# Patient Record
Sex: Male | Born: 1945 | Race: Black or African American | Hispanic: No | Marital: Married | Smoking: Former smoker
Health system: Southern US, Community
[De-identification: ages and names within clinical notes are randomized; demographics above are authoritative.]

## PROBLEM LIST (undated history)

## (undated) DIAGNOSIS — Z7739 Contact with and (suspected) exposure to other war theater: Secondary | ICD-10-CM

## (undated) DIAGNOSIS — K219 Gastro-esophageal reflux disease without esophagitis: Secondary | ICD-10-CM

## (undated) DIAGNOSIS — K036 Deposits [accretions] on teeth: Secondary | ICD-10-CM

## (undated) DIAGNOSIS — F4312 Post-traumatic stress disorder, chronic: Secondary | ICD-10-CM

## (undated) DIAGNOSIS — T7840XA Allergy, unspecified, initial encounter: Secondary | ICD-10-CM

## (undated) DIAGNOSIS — I8310 Varicose veins of unspecified lower extremity with inflammation: Secondary | ICD-10-CM

## (undated) DIAGNOSIS — I10 Essential (primary) hypertension: Secondary | ICD-10-CM

## (undated) DIAGNOSIS — K0252 Dental caries on pit and fissure surface penetrating into dentin: Secondary | ICD-10-CM

## (undated) DIAGNOSIS — L209 Atopic dermatitis, unspecified: Secondary | ICD-10-CM

## (undated) DIAGNOSIS — M1990 Unspecified osteoarthritis, unspecified site: Secondary | ICD-10-CM

## (undated) DIAGNOSIS — K0262 Dental caries on smooth surface penetrating into dentin: Secondary | ICD-10-CM

## (undated) DIAGNOSIS — G4733 Obstructive sleep apnea (adult) (pediatric): Secondary | ICD-10-CM

## (undated) DIAGNOSIS — H2513 Age-related nuclear cataract, bilateral: Secondary | ICD-10-CM

## (undated) DIAGNOSIS — L301 Dyshidrosis [pompholyx]: Secondary | ICD-10-CM

## (undated) DIAGNOSIS — J310 Chronic rhinitis: Secondary | ICD-10-CM

## (undated) DIAGNOSIS — E291 Testicular hypofunction: Secondary | ICD-10-CM

## (undated) DIAGNOSIS — E669 Obesity, unspecified: Secondary | ICD-10-CM

## (undated) DIAGNOSIS — G8929 Other chronic pain: Secondary | ICD-10-CM

## (undated) DIAGNOSIS — H04129 Dry eye syndrome of unspecified lacrimal gland: Secondary | ICD-10-CM

## (undated) DIAGNOSIS — G4730 Sleep apnea, unspecified: Secondary | ICD-10-CM

## (undated) DIAGNOSIS — L299 Pruritus, unspecified: Secondary | ICD-10-CM

## (undated) DIAGNOSIS — J338 Other polyp of sinus: Secondary | ICD-10-CM

## (undated) DIAGNOSIS — N62 Hypertrophy of breast: Secondary | ICD-10-CM

## (undated) DIAGNOSIS — Z77098 Contact with and (suspected) exposure to other hazardous, chiefly nonmedicinal, chemicals: Secondary | ICD-10-CM

## (undated) DIAGNOSIS — H35373 Puckering of macula, bilateral: Secondary | ICD-10-CM

## (undated) DIAGNOSIS — F4310 Post-traumatic stress disorder, unspecified: Secondary | ICD-10-CM

## (undated) HISTORY — DX: Allergy, unspecified, initial encounter: T78.40XA

## (undated) HISTORY — DX: Unspecified osteoarthritis, unspecified site: M19.90

## (undated) HISTORY — DX: Dyshidrosis (pompholyx): L30.1

## (undated) HISTORY — DX: Varicose veins of unspecified lower extremity with inflammation: I83.10

## (undated) HISTORY — DX: Post-traumatic stress disorder, chronic: F43.12

## (undated) HISTORY — DX: Gastro-esophageal reflux disease without esophagitis: K21.9

## (undated) HISTORY — DX: Age-related nuclear cataract, bilateral: H25.13

## (undated) HISTORY — DX: Other chronic pain: G89.29

## (undated) HISTORY — DX: Testicular hypofunction: E29.1

## (undated) HISTORY — DX: Puckering of macula, bilateral: H35.373

## (undated) HISTORY — DX: Pruritus, unspecified: L29.9

## (undated) HISTORY — PX: COLONOSCOPY: SHX174

## (undated) HISTORY — DX: Essential (primary) hypertension: I10

## (undated) HISTORY — DX: Dental caries on smooth surface penetrating into dentin: K02.62

## (undated) HISTORY — DX: Obesity, unspecified: E66.9

## (undated) HISTORY — DX: Other polyp of sinus: J33.8

## (undated) HISTORY — DX: Deposits (accretions) on teeth: K03.6

## (undated) HISTORY — DX: Contact with and (suspected) exposure to other war theater: Z77.39

## (undated) HISTORY — DX: Dental caries on pit and fissure surface penetrating into dentin: K02.52

## (undated) HISTORY — DX: Obstructive sleep apnea (adult) (pediatric): G47.33

## (undated) HISTORY — DX: Hypertrophy of breast: N62

## (undated) HISTORY — DX: Chronic rhinitis: J31.0

## (undated) HISTORY — DX: Atopic dermatitis, unspecified: L20.9

## (undated) HISTORY — PX: SHOULDER ARTHROSCOPY: SHX128

## (undated) HISTORY — DX: Contact with and (suspected) exposure to other hazardous, chiefly nonmedicinal, chemicals: Z77.098

## (undated) HISTORY — PX: APPENDECTOMY: SHX54

---

## 1998-03-02 ENCOUNTER — Ambulatory Visit: Admission: RE | Admit: 1998-03-02 | Discharge: 1998-03-02 | Payer: Self-pay | Admitting: Family Medicine

## 1998-11-17 ENCOUNTER — Encounter: Payer: Self-pay | Admitting: Family Medicine

## 1998-11-17 ENCOUNTER — Ambulatory Visit (HOSPITAL_COMMUNITY): Admission: RE | Admit: 1998-11-17 | Discharge: 1998-11-17 | Payer: Self-pay | Admitting: Family Medicine

## 2000-09-04 ENCOUNTER — Encounter: Admission: RE | Admit: 2000-09-04 | Discharge: 2000-09-04 | Payer: Self-pay | Admitting: Family Medicine

## 2000-09-04 ENCOUNTER — Encounter: Payer: Self-pay | Admitting: Family Medicine

## 2001-01-09 ENCOUNTER — Ambulatory Visit (HOSPITAL_BASED_OUTPATIENT_CLINIC_OR_DEPARTMENT_OTHER): Admission: RE | Admit: 2001-01-09 | Discharge: 2001-01-09 | Payer: Self-pay | Admitting: Orthopedic Surgery

## 2002-09-15 ENCOUNTER — Encounter: Admission: RE | Admit: 2002-09-15 | Discharge: 2002-09-15 | Payer: Self-pay | Admitting: Family Medicine

## 2002-09-15 ENCOUNTER — Encounter: Payer: Self-pay | Admitting: Family Medicine

## 2003-01-11 ENCOUNTER — Encounter: Payer: Self-pay | Admitting: Family Medicine

## 2003-01-11 ENCOUNTER — Encounter: Admission: RE | Admit: 2003-01-11 | Discharge: 2003-01-11 | Payer: Self-pay | Admitting: Family Medicine

## 2003-10-12 ENCOUNTER — Encounter: Admission: RE | Admit: 2003-10-12 | Discharge: 2003-10-12 | Payer: Self-pay | Admitting: Family Medicine

## 2006-07-29 ENCOUNTER — Ambulatory Visit: Payer: Self-pay | Admitting: Gastroenterology

## 2006-08-05 ENCOUNTER — Ambulatory Visit: Payer: Self-pay | Admitting: Gastroenterology

## 2006-08-05 ENCOUNTER — Encounter (INDEPENDENT_AMBULATORY_CARE_PROVIDER_SITE_OTHER): Payer: Self-pay | Admitting: Specialist

## 2007-02-17 ENCOUNTER — Encounter: Admission: RE | Admit: 2007-02-17 | Discharge: 2007-02-17 | Payer: Self-pay | Admitting: Family Medicine

## 2007-06-25 ENCOUNTER — Emergency Department (HOSPITAL_COMMUNITY): Admission: EM | Admit: 2007-06-25 | Discharge: 2007-06-25 | Payer: Self-pay | Admitting: Emergency Medicine

## 2007-07-03 ENCOUNTER — Ambulatory Visit (HOSPITAL_BASED_OUTPATIENT_CLINIC_OR_DEPARTMENT_OTHER): Admission: RE | Admit: 2007-07-03 | Discharge: 2007-07-04 | Payer: Self-pay | Admitting: Orthopedic Surgery

## 2008-03-23 ENCOUNTER — Ambulatory Visit (HOSPITAL_BASED_OUTPATIENT_CLINIC_OR_DEPARTMENT_OTHER): Admission: RE | Admit: 2008-03-23 | Discharge: 2008-03-24 | Payer: Self-pay | Admitting: Orthopedic Surgery

## 2009-05-11 IMAGING — CR DG KNEE 1-2V*L*
2 series · 2 of 2 positions shown · non-contrast
Comparison: none

CLINICAL DATA: Left knee pain.  No injury. 
 LEFT KNEE TWO VIEWS:
 Two views of the left knee were obtained.  Joint spaces are well preserved.  No fracture or effusion is seen.

[view not recorded (1 of 2)]
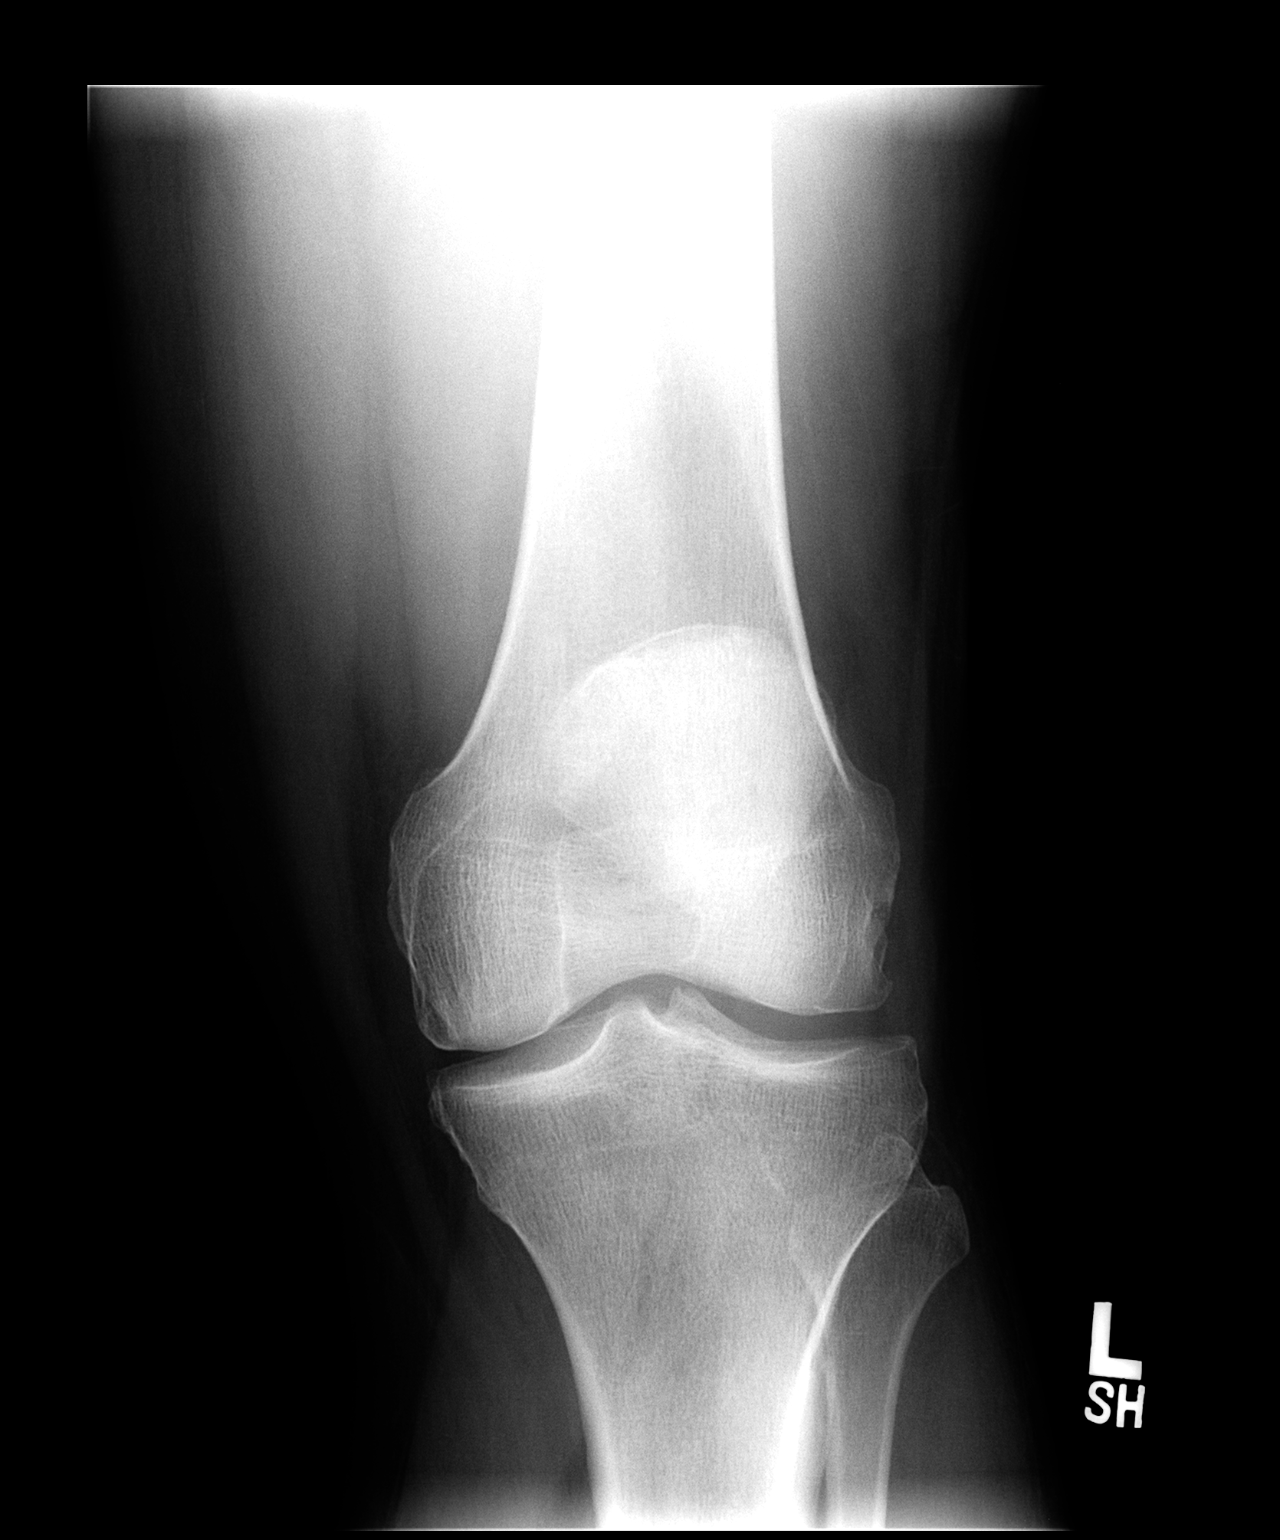

[view not recorded (2 of 2)]
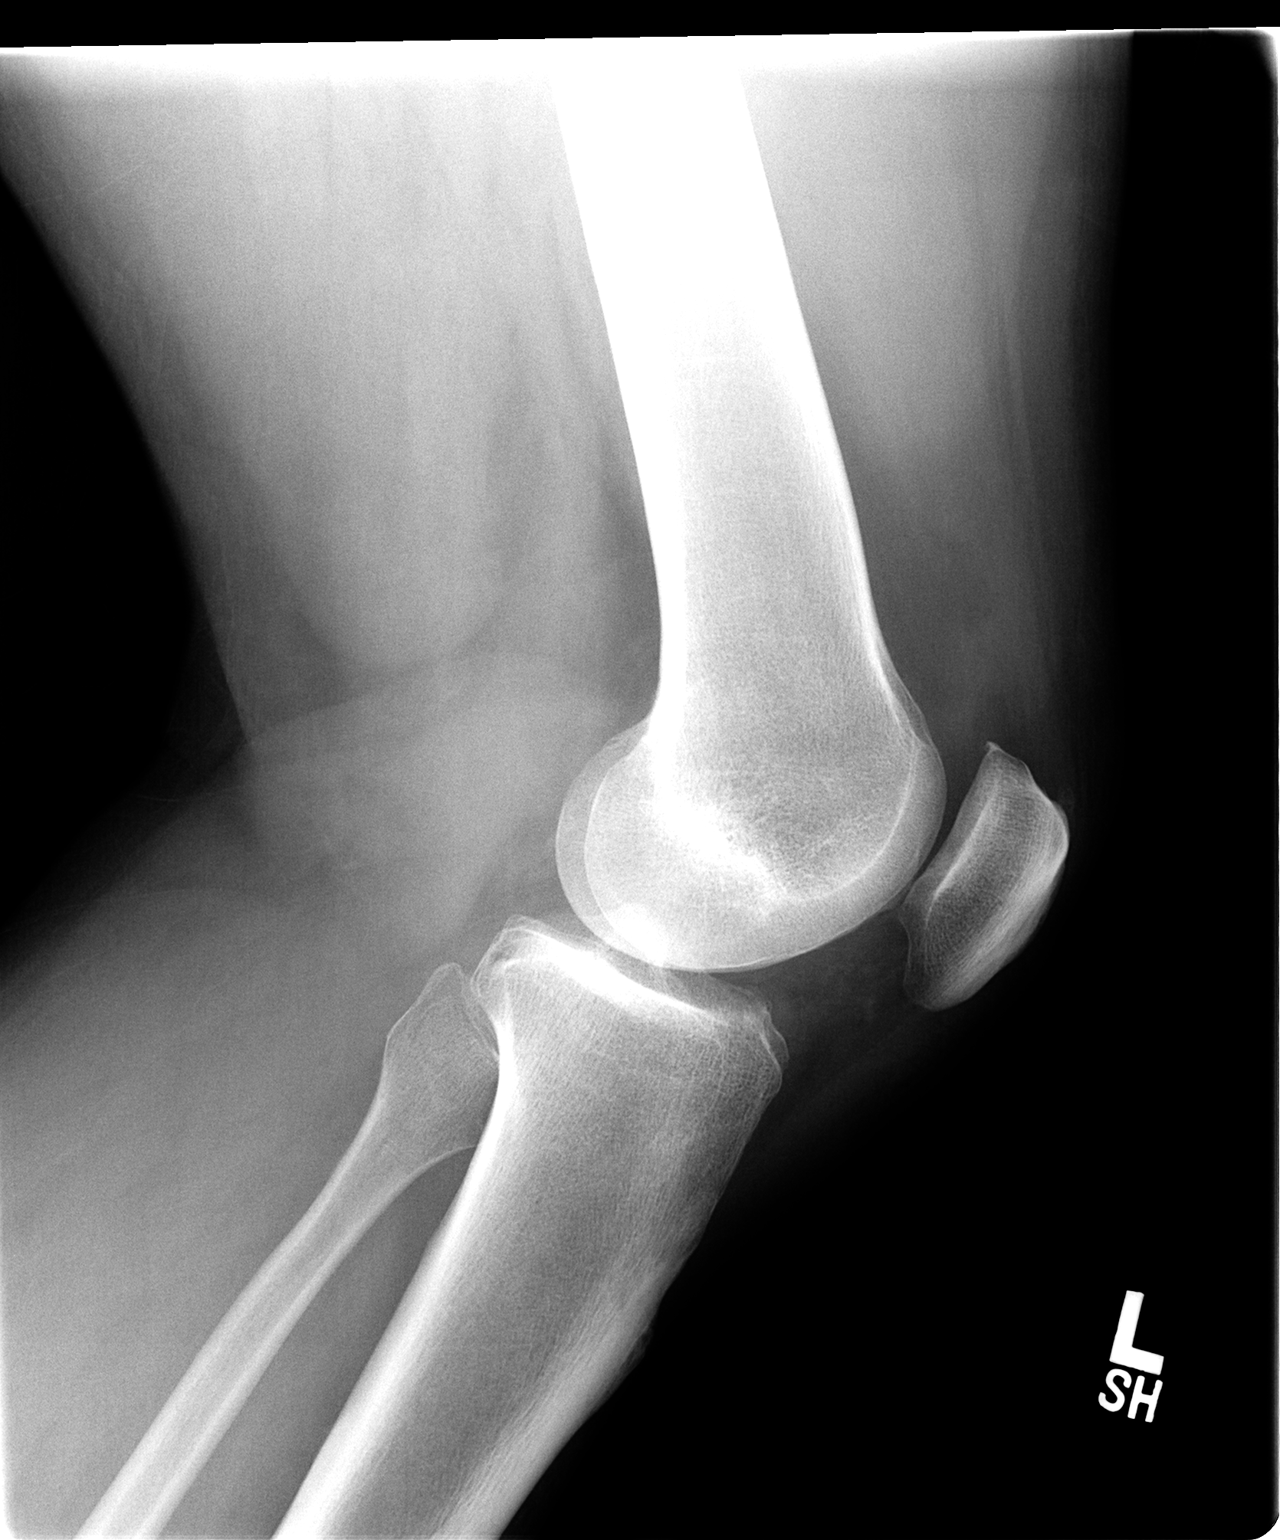

[2 of 2 positions shown; findings below may reference images not displayed]

IMPRESSION: Negative left knee.

## 2010-09-05 NOTE — Op Note (Signed)
[name], [name]               ACCOUNT NO.:  [id number]   MEDICAL RECORD NO.:  [id number]          PATIENT TYPE:  AMB   LOCATION:  DSC                          FACILITY:  [facility]   PHYSICIAN:  Katy Fitch. Sypher, M.D. 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   POSTOPERATIVE DIAGNOSIS:  Complex mid substance necrotic explosion type  rupture of the supraspinatus, infraspinatus and teres minor tendon and  partial superior degenerative tear of subscapularis tendon, right  shoulder.   OPERATION:  1. Diagnostic arthroscopy right glenohumeral joint.  2. Arthroscopic debridement of synovitis and partial debridement of      subscapularis rotator cuff tear and synovectomy.  3. Open subacromial decompression with bursectomy, coracoacromial      ligament relaxation, and resection of medial acromial osteophyte at      acromioclavicular joint.  4. Open resection of distal clavicle.  5. Complex relamination/reconstruction of massive necrotic rotator      cuff tear utilizing two bio-corkscrew anchors to gather the      infraspinatus and teres minor and two McLaughlin through bone      sutures gathering the supraspinatus and infraspinatus and an over-      the-top delay suture to a swivel lock to neutralize tension on the      supraspinatus and infraspinatus rotator cuff repair.   SURGEON:  Katy Fitch. Sypher, M.D.   ASSISTANT:  Jonni Sanger, P.A.  It should be noted that Mr.  Dasnoit's assistance was essential for this complex repair providing  retraction as well as arm positioning for the entire duration of the  case. It should be noted, also, that a scrubbed nursing assistant was  also required to position Mr. Fluegel very heavy  arm as we proceeded  with this complex reconstructive surgery.   ANESTHESIA:  General endotracheal supplemented by a right interscalene  block.   SUPERVISING ANESTHESIOLOGIST:  Quita Skye. Krista Blue, M.D.   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  On June 25, 2007, he slipped on an icy surface  falling very hard onto his right arm in an abducted position.  Since  that time, he has had extreme pain in the right shoulder and has been  unable to abduct the shoulder against gravity. He was initially  evaluated at an [facility] where x-rays of the shoulder revealed  no evidence of fracture.  He was thought to have a possible rotator cuff  tear.  On July 02, 2007, we were asked to provide an urgent upper  extremity orthopedic consult to assess his predicament.  At the time of  his consult, he was noted have no active abduction of his arm and  extreme weakness of external rotation.  Plain films of the shoulder  documented no evidence of fracture.  He had baseline degenerative  arthritis of the  AC joint and compromise of his subacromial outlet. He  did not have sclerosis of the greater tuberosity consistent with chronic  rotator cuff disease.  An MRI of the shoulder had been ordered and  revealed a complex explosion type retracted rotator cuff tear.   During informed consent, I explained to Mr. Khan that he had a very  complex and severe rotator cuff rupture predicament.  The only chance  for repair would be to proceed as soon as possible with exploration and  attempted reconstruction of this tear.  Mid substance tendon tears are  extremely challenging to repair due to poor tissue quality and have a  high rate of failure.  Our goal will be to repair as much of his rotator  cuff as possible to recreate a stabilizing sling effect between the  subscapularis, his infraspinatus and teres minor, and hopefully restore  some of his  supraspinatus function.  Preoperatively, he was advised that  he would not be able to return to work for a minimum of three months and  would not be able to do any overhead lifting for 6-9 months following  this surgery.  No guarantees were provided that we would be able to  repair his tendon nor ensure that a repair accomplished would be  maintained.  After informed consent, he is brought to the operating room  at this time.   DESCRIPTION OF PROCEDURE:  Prior to surgery, he was interviewed by Dr.  Krista Blue and after an explanation of potential risks and benefits of an  interscalene block and general anesthesia, he proceeded with the  interscalene block under Dr. Robina Ade supervision in the holding area  and subsequently was transferred to room 8 and under Dr. Robina Ade direct  supervision, placed under general endotracheal anesthesia.  He was  carefully positioned in the beach chair position with the aid of a torso  and head holder designed for arthroscopy.  The entire right upper  extremity and forequarter were prepped with DuraPrep and draped with  impervious arthroscopy drapes.   The procedure commenced with placement of the arthroscope through a  standard posterior viewing portal.  Diagnostic arthroscopy revealed a  very complex mid substance explosion type tear of the entire  infraspinatus, teres minor and about 80% of the supraspinatus. The long  head of the biceps was intact.  The superior 10% of the subscapularis  had a retracted degenerative tear.  There was a considerable amount of  adhesive capsulitis and granulation tissue present in the anterior  shoulder capsule.  The anterior portal was created under direct vision  and a 4.5 mm suction shaver was used to debride the capsulitis,  synovitis, rotator cuff debris, and the small degenerative fragment of  the subscapularis.   We then proceeded directly to an open reconstruction given the fact that  considerable exposure was  going to be required to try to cobble together  this exploded rotator cuff.  A 10 cm incision was fashioned from the  distal clavicle across the anterior acromion. With great care, the  deltoid anterior third was elevated subperiosteally off the acromion,  the coracoacromial ligament released, and the bursa entered. After  debridement of clot and other fibrinous debris, we were able to assess  the complex rotator cuff predicament.   The supraspinatus posterior 80% had avulsed off the tuberosity.  The  infraspinatus had ruptured in mid substance with a 2 cm long segment  remaining on the tuberosity that was quite necrotic.  The teres  minor  was avulsed partially posteriorly and a considerable longitudinal tear  was noted between the infraspinatus and teres minor.  With great care  utilizing a Joseph skin hook, we were able to retrieve the retracted  infraspinatus and supraspinatus, and ultimately after debriding the  margins of the rupture with scalpel and scissors, we were able to place  a series of baseball stitch sutures gathering the infraspinatus and  supraspinatus. Given the mid substance tear of the infraspinatus, this  repair will be quite tenuous.  Two posterior anchors were placed, one at  the superior margin of the teres minor, one at the central portion of  the infraspinatus, and a pair of McLaughlin through bone suture tunnels  created for repair of the supraspinatus and infraspinatus anteriorly.  The supraspinatus was gathered with one McLaughlin suture and the  infraspinatus with a second and after tensioning these, a total of four  mattress sutures were used to re-inset the teres minor and infraspinatus  to decorticated tuberosity.  The supraspinatus was advanced in anatomic  footprint and inset with through bone McLaughlin sutures.  This was  subsequently finished with an over-the-top delay type suture secured  with a swivel lock anchor and a second suture that is anchored  within  the swivel lock was used as an over-the-top suture, insetting the  margin.  We ultimately re-established the anatomic footprint of the  infraspinatus and supraspinatus, however, given the poor quality of the  infraspinatus, there are no guarantees that this will remain intact.   The wound was thoroughly lavaged with sterile saline.  The distal 15 mm  of clavicle were resected and the medial acromion was resected leveling  the acromion to a type 1 morphology. Complete synovectomy of the [facility]  joint was accomplished.  After lavage of the subacromial space with  sterile saline using the arthroscopic pump, the deltoid was repaired to  the trapezius closing dead space created by resection of the distal  clavicle and the deltoid was then repaired to the periosteum of the  acromion and the capsule of the [facility] joint anatomically with multiple  figure-of-eight sutures of #2 FiberWire knots buried. The deltoid split  anterolaterally was repaired with a simple suture of 0 Vicryl.  After  further irrigation, the skin was repaired with subdermal sutures of 2-0  Vicryl and intradermal 3-0 Prolene with Steri-Strips.  Mr. Frazer was  placed in a Don Joy abduction sling, awakened from general anesthesia,  and transferred to the recovery room with stable signs.  There were no  apparent complications.  Passive compression pumps were used throughout  the procedure for deep vein thrombosis prophylaxis.   During my postoperative conference with Mrs. Mcfall, I had a very  detailed discussion of the complexity of his injury and the poor quality  of his tendon.  I also encouraged her to help him take walks this  evening to try to diminish his risks of deep vein thrombosis.  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 We  will be very vigilant for perioperative complications including deep  vein thrombosis and pulmonary embolus.  We will monitor his O2  saturation this evening  and provide supportive care with p.o. and IV  Dilaudid and Ancef 1 gram IV q.8h. as a prophylactic antibiotic x3  doses.   Note that this was a very complex procedure that will be submitted to  the industrial commission and the insurer as a complex procedure for  additional consideration by report documentation.  In addition, it  should be noted that not one but two surgical assistants were required  for positioning of this very muscular and heavy arm.      Katy Fitch Sypher, M.D.  Electronically Signed     RVS/MEDQ  D:  07/03/2007  T:  07/05/2007  Job:  [id number]

## 2010-09-05 NOTE — Op Note (Signed)
Joseph Khan, Joseph Khan               ACCOUNT NO.:  [id number]   MEDICAL RECORD NO.:  [id number]          PATIENT TYPE:  AMB   LOCATION:  DSC                          FACILITY:  [facility]   PHYSICIAN:  Katy Fitch. Sypher, M.D. DATE OF BIRTH:  Feb 20, 1946   DATE OF PROCEDURE:  03/23/2008  DATE OF DISCHARGE:                               OPERATIVE REPORT   PREOPERATIVE DIAGNOSIS:  A 7-1/2 months status post repair of massive  right rotator cuff tear with necrotic rotator cuff with subsequent  development of profound intra-articular and extra-articular adhesions to  the rotator cuff limiting range of motion, unresponsive to physical  therapy.   POSTOPERATIVE DIAGNOSES:  Severe adhesive capsulitis and tenodesis of  entire rotator cuff to deep surface of clavicle, acromion,  coracoacromial ligament, and deltoid muscle due to avascular status of  repaired rotator cuff.   OPERATION:  1. Examination of right shoulder under anesthesia.  2. Arthroscopic capsulectomy, tenolysis of subscapularis, and      recreation of rotator interval.  3. Extreme tenolysis of rotator cuff with resection of adhesions      between clavicle, acromion, coracoacromial ligament, and deltoid      muscle to rotator cuff with hemostasis of feeding blood vessels and      gentle manipulation of shoulder under anesthesia.   OPERATING SURGEON:  Katy Fitch. Sypher, MD   ASSISTANT:  Marveen Reeks Dasnoit, PA-C   ANESTHESIA:  General endotracheal supplemented by right interscalene  block.   SUPERVISING ANESTHESIOLOGIST:  Guadalupe Maple, MD   INDICATIONS:  Joseph Khan is a 65 year old gentleman employed by [employer].  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   We are able to achieve an anatomic footprint of his repair with a  combination of arthroscopic and open technique.   He went on to heal his tendon but had severe adhesions due to the  avascular  nature of his chronic necrotic rotator cuff tear.   Preoperatively, we had advised him that he would likely require a second  capsulectomy and tenolysis stage given the nature of his primary repair.   After 63 months of therapy, Joseph Khan had combined elevation of his  shoulder of 130 degrees and external rotation at 90 degrees, abduction  of 60 degrees.  He had 40 degrees of internal rotation and could barely  reach behind his iliac crest.   Due to the profound nature of his adhesions and noting the status of his  rotator cuff, we recommended proceeding with examination of his shoulder  under anesthesia and arthroscopic tenolysis and capsulectomy in an  effort to improve his range of motion.   Preoperatively, he was advised that we would try to aggressively  tenolyse his cuff and capsule and consider release of coracohumeral  ligament.  He understands that he will require months of therapy  following this procedure in an effort to optimize his range of motion.   Confounding issues include the fact that Joseph Khan is a very large man,  weighing 258 pounds at 5  feet 6 inches height.   Preoperatively, questions were invited and answered in detail with Mr.  Khan and his wife.  He was provided 1 g of Ancef as an IV prophylactic  antibiotic in preoperative holding area.   After questions were invited and answered, she is brought to the  operating room at this time.   PROCEDURE:  Joseph Khan was brought to the operating room and placed  in a supine position on the operating table.   Following placement of an interscalene block by Dr. Noreene Larsson in the  holding area, a very satisfactory anesthesia of the right shoulder was  noted.   He was brought to room #6 of the [facility], placed in a  supine position on the operating table, and under Dr. Morley Kos direct  supervision, general endotracheal anesthesia induced utilizing a  GlideScope.   Joseph Khan was carefully  positioned in the beach chair position with aid  of a torso and head holders on for arthroscopy.  Examination of right  shoulder under anesthesia revealed combined elevation of 130 degrees  with severe limitation of glenohumeral rotation.  At 90 degrees  abduction, his external rotation was limited at 60 degrees and internal  rotation at 20 degrees.  He lacked extension to the scapular plane by 40  degrees.  The adhesions were so dense that no effort was made to  manipulate the shoulder.   The entire right upper extremity four-quarter then prepped with DuraPrep  and draped with impervious arthroscopy drapes.   The procedure commenced with instrumentation of the shoulder from an  anterior approach utilizing the prior anterior portal with a switching  stick.  The scope was placed with inside-out technique into the  glenohumeral joint.  The first looking into the glenohumeral joint  revealed a profound degree of inflammation and fibrous adhesions.  The  shoulder joint was distended with a pump set at 80 cm of water.  The  anterior capsular anatomy was obliterated by dense fibrous adhesions  from the glenoid labrum to the biceps tendon and lesser tuberosity.  The  anatomy of the subscapularis was completely obliterated, and the rotator  interval anatomy was severely deranged and contracted.   An anterior portal was created with some difficulty through the dense  scar utilizing an 18-gauge needle followed by a dilator switching stick  cannula and ultimately the suction shaver.  A 4.5-mm suction shaver was  used to perform anterior capsulectomy with use of cutting cautery to  release adhesions and aggressive debridement until the subscapularis  anatomy was identified.  The subscapularis was tenolysed, both intra-  articularly and the superficial anterior surface.  Care was taken not to  violate the coracoid nor the conjoined tendon.   The rotator interval was reestablished by capsulectomy  and the long head  of the biceps was tenolysed.  The repair of the supraspinatus,  infraspinatus, teres minor was well visualized and the through bone  sutures were seen.  A very satisfactory anatomic footprint along the  articular surface had been recreated.  The shoulder joint was  meticulously debrided of all granulation tissue followed by use of the  bipolar cautery for hemostasis.   The scope was removed from the glenohumeral joint and placed through the  posterior portal into the subacromial space.  With great difficulty, a  lateral portal was established once again dealing with very dense scar  in the subacromial space.  With bimanual technique, we created a  subacromial space and with  a 4.5-mm suction shaver, began a tenolysis of  the rotator cuff from the deep surface of the acromion, clavicle,  coracoacromial ligament, and deltoid.  Nearly 1 hour of dissection was  required meticulously tenolysing the rotator cuff.  The stack suture  knots were identified and carefully preserved throughout the procedure.  There was a very dense adhesion at the site of the deltoid split for the  repair.  This was ultimately removed until muscle fiber of the deltoid  was visible.   At the conclusion of this debridement, we were able to increase the  internal and external rotation arc by at least 60 degrees.  Due to  swelling of the shoulder, a combined elevation was not possible to  check.   Our hope is that this extensive debridement will facilitate early  rehabilitation once the swelling subsides and allow Mr. Trompeter to begin  a daily therapy program with an effort to recover as near as possible  anatomic range of motion.   On a positive note, we had excellent healing of the rotator cuff to an  anatomic footprint.  Given the avascular nature of the cuff at the time  of the primary repair, it is likely, however, that there will be a  tendency to form new adhesions due to the avascular  nature of the  tendon.   Early range of motion will be employed to try to prevent recurrent  adhesions.   Mr. Falzone portals were repaired with subcutaneous suture of 4-0  Vicryl and intradermal 3-0 Prolene followed by dressing with sterile  gauze and paper tape.  He was placed in a sling and transferred to the  recovery room in stable signs.   We will gently range his shoulder later this evening while the block is  still in place.      Katy Fitch Sypher, M.D.  Electronically Signed     RVS/MEDQ  D:  03/23/2008  T:  03/24/2008  Job:  [id number]

## 2010-09-08 NOTE — Op Note (Signed)
[facility]  Patient:    Joseph Khan, Joseph Khan Visit Number: [id number] MRN: [id number]          Service Type: DSU Location: [facility] Attending Physician:  Nadara Mustard Proc. Date: 01/09/01 Admit Date:  01/09/2001                             Operative Report  PREOPERATIVE DIAGNOSIS:  Right knee medial and lateral meniscal tear.  POSTOPERATIVE DIAGNOSIS:  Right knee medial and lateral meniscal tear.  PROCEDURE:  Partial medial and lateral meniscectomy as well as partial synovectomy.  SURGEON:  Nadara Mustard, M.D.  ANESTHESIA:  General endotracheal anesthesia.  ESTIMATED BLOOD LOSS:  Minimal.  ANTIBIOTICS:  None.  DRAINS:  None.  COMPLICATIONS:  None.  INJECTION:  20 cc of 0.5% Marcaine plain with 4 mg of morphine.  DISPOSITION:  PACU in stable condition.  INDICATIONS:  The patient is a 65 year old gentleman with mechanical catching and locking of his right knee.  The patient has failed conservative care.  MRI scans confirm the medial and lateral meniscal tear and presents at this time for surgical intervention.  The risks and benefits were discussed including infection, neurovascular injury, persistent pain, need for additional surgery. The patient states he understands and wishes to proceed at this time.  DESCRIPTION OF PROCEDURE:  The patient was brought to OR room 5 and underwent a general anesthesia.  After adequate level of anesthesia was obtained, the patients right lower extremity was prepped using Duraprep and draped in the sterile field.  Inferolateral portal was established for the arthroscope and inferomedial portal was established for the working portal.  Visualization of the patellofemoral joint showed a significant amount of synovitis. Visualization of the medial joint line showed no significant pathology of the medial femoral condyle or medial tibial plateau.  He did have a large tear in the posterior horn of the medial  meniscus and using the biter and shaver this was debrided.  The synovitis was also debrided and vapor was used for hemostasis.  The ACL was stable and intact.  Examination of the lateral joint line showed degenerative tearing of the lateral aspect of the lateral meniscus and this was debrided with a shaver.  The anterior synovitis was also debrided and the vapor mytech was used for hemostasis.  This was not touched on any cartilage.  Visualization as well as probing of the medial and lateral joint line showed a stable meniscal remnants.  He had a stable meniscus both medially and laterally.  Examination of the patellofemoral joint showed significant synovitis as well as some mild degenerative changes of the patellofemoral joint with ebernated bone over the trochlea.  The shaver was used for debridement of the synovitis.  Vapor was used for hemostasis.  Medial and lateral gutters were checked.  There were no loose bodies.  The joint was irrigated.  The instruments were removed.  The portals were closed using 4-0 nylon.  The wound was covered with Adaptic, orthopedic sponges, ABD dressing, and Webril and Ace wrap from the toes to the thigh.  The patient was extubated and taken to PACU in stable condition.  Total injection 20 cc of 0.5% Marcaine plain with 4 mg of morphine.  Plan to follow up in the office in two weeks. Attending Physician:  Nadara Mustard DD:  01/09/01 TD:  01/09/01 Job: [id number]

## 2011-01-15 LAB — POCT I-STAT, CHEM 8
BUN: 22
Calcium, Ion: 1.17
Chloride: 101
Creatinine, Ser: 1.2
Glucose, Bld: 94
HCT: 42
Hemoglobin: 14.3
Potassium: 3.8
Sodium: 139
TCO2: 27

## 2011-01-23 LAB — BASIC METABOLIC PANEL
BUN: 12 mg/dL (ref 6–23)
CO2: 30 mEq/L (ref 19–32)
Calcium: 9.1 mg/dL (ref 8.4–10.5)
Chloride: 106 mEq/L (ref 96–112)
Creatinine, Ser: 1.15 mg/dL (ref 0.4–1.5)
GFR calc Af Amer: >60 mL/min (ref >60)
GFR calc non Af Amer: >60 mL/min (ref >60)
Glucose, Bld: 121 mg/dL — ABNORMAL HIGH (ref 70–99)
Potassium: 3.9 mEq/L (ref 3.5–5.1)
Sodium: 142 mEq/L (ref 135–145)

## 2011-01-25 LAB — POCT HEMOGLOBIN-HEMACUE: Hemoglobin: 14.9 g/dL (ref 13.0–17.0)

## 2011-08-01 ENCOUNTER — Encounter: Payer: Self-pay | Admitting: Gastroenterology

## 2016-01-03 NOTE — H&P (Signed)
TOTAL KNEE ADMISSION H&P  Patient is being admitted for left total knee arthroplasty.  Subjective:  Chief Complaint:     Left knee primary OA / pain  HPI: Antwaan Helmkamp, 70 y.o. male, has a history of pain and functional disability in the left knee due to arthritis and has failed non-surgical conservative treatments for greater than 12 weeks to include NSAID's and/or analgesics, corticosteriod injections, viscosupplementation injections and activity modification.  Onset of symptoms was gradual, starting 5-6 years ago with gradually worsening course since that time. The patient noted no past surgery on the left knee(s).  Patient currently rates pain in the left knee(s) at 8 out of 10 with activity. Patient has worsening of pain with activity and weight bearing, pain that interferes with activities of daily living, pain with passive range of motion, crepitus and joint swelling.  Patient has evidence of periarticular osteophytes and joint space narrowing by imaging studies. There is no active infection.  Risks, benefits and expectations were discussed with the patient.  Risks including but not limited to the risk of anesthesia, blood clots, nerve damage, blood vessel damage, failure of the prosthesis, infection and up to and including death.  Patient understand the risks, benefits and expectations and wishes to proceed with surgery.   PCP: Donnie Coffin, MD  D/C Plans:      Home  Post-op Meds:       No Rx given  Tranexamic Acid:      To be given - IV   Decadron:     Iis to be given  FYI:     ASA  Norco  CPAP     Past Medical History:  Diagnosis Date  . Arthritis    Osteoarthrits /pain left knee  . Chronically dry eyes    bilateral  . GERD (gastroesophageal reflux disease)    omeprazole controls  . Hypertension   . Post traumatic stress disorder (PTSD)    "night mares, shadow boxing"  . Sleep apnea    Cpap settings automatic    Past Surgical History:  Procedure Laterality Date   . APPENDECTOMY    . SHOULDER ARTHROSCOPY Right    rotator cuff repair    No prescriptions prior to admission.   No Known Allergies   Social History  Substance Use Topics  . Smoking status: Former Smoker    Years: 10.00    Types: Cigars    Quit date: 01/10/1982  . Smokeless tobacco: Never Used  . Alcohol use No    Family History  Problem Relation Age of Onset  . CVA Mother   . Other Father      Review of Systems  Constitutional: Negative.   HENT: Negative.   Eyes: Negative.   Respiratory: Negative.   Cardiovascular: Negative.   Gastrointestinal: Positive for heartburn.  Genitourinary: Negative.   Musculoskeletal: Positive for joint pain.  Skin: Negative.   Neurological: Negative.   Endo/Heme/Allergies: Positive for environmental allergies.  Psychiatric/Behavioral: Positive for depression (PTSD).    Objective:  Physical Exam  Constitutional: He is oriented to person, place, and time. He appears well-developed.  HENT:  Head: Normocephalic.  Mouth/Throat: He has dentures.  Eyes: Pupils are equal, round, and reactive to light.  Neck: Neck supple. No JVD present. No tracheal deviation present. No thyromegaly present.  Cardiovascular: Normal rate, regular rhythm, normal heart sounds and intact distal pulses.   Respiratory: Effort normal and breath sounds normal. No respiratory distress. He has no wheezes.  GI: Soft. There is no  tenderness. There is no guarding.  Musculoskeletal:       Left knee: He exhibits decreased range of motion, swelling and bony tenderness. He exhibits no ecchymosis, no deformity, no laceration and no erythema. Tenderness found.  Lymphadenopathy:    He has no cervical adenopathy.  Neurological: He is alert and oriented to person, place, and time.  Skin: Skin is warm and dry.  Psychiatric: He has a normal mood and affect.       Imaging Review Plain radiographs demonstrate severe degenerative joint disease of the left knee(s). The overall  alignment isneutral. The bone quality appears to be good for age and reported activity level.  Assessment/Plan:  End stage arthritis, left knee   The patient history, physical examination, clinical judgment of the provider and imaging studies are consistent with end stage degenerative joint disease of the left knee(s) and total knee arthroplasty is deemed medically necessary. The treatment options including medical management, injection therapy arthroscopy and arthroplasty were discussed at length. The risks and benefits of total knee arthroplasty were presented and reviewed. The risks due to aseptic loosening, infection, stiffness, patella tracking problems, thromboembolic complications and other imponderables were discussed. The patient acknowledged the explanation, agreed to proceed with the plan and consent was signed. Patient is being admitted for inpatient treatment for surgery, pain control, PT, OT, prophylactic antibiotics, VTE prophylaxis, progressive ambulation and ADL's and discharge planning. The patient is planning to be discharged home with home health services.     [place] Torren Maffeo   PA-C  01/12/2016, 1:06 PM

## 2016-01-10 NOTE — Patient Instructions (Addendum)
Joseph Khan  01/10/2016   Your procedure is scheduled on: 01-17-16   Report to [facility]  Entrance take [facility]  elevators to 3rd floor to  [place] at   0830 AM.  Call this number if you have problems the morning of surgery [phone]   Remember: ONLY 1 PERSON MAY GO WITH YOU TO SHORT STAY TO GET  READY MORNING OF [facility].  Do not eat food or drink liquids :After Midnight.   Bring cpap mask/tubing.  Take these medicines the morning of surgery with A SIP OF WATER: Loratadine. Omeprazole. DO NOT TAKE ANY DIABETIC MEDICATIONS DAY OF YOUR SURGERY                               You may not have any metal on your body including hair pins and              piercings  Do not wear jewelry, make-up, lotions, powders or perfumes, deodorant             Do not wear nail polish.  Do not shave  48 hours prior to surgery.              Men may shave face and neck.   Do not bring valuables to the hospital. [place].  Contacts, dentures or bridgework may not be worn into surgery.  Leave suitcase in the car. After surgery it may be brought to your room.     Patients discharged the day of surgery will not be allowed to drive home.  Name and phone number of your driver: Ella-spouse [phone] cell  Special Instructions: N/A              Please read over the following fact sheets you were given: _____________________________________________________________________             [facility] - Preparing for Surgery Before surgery, you can play an important role.  Because skin is not sterile, your skin needs to be as free of germs as possible.  You can reduce the number of germs on your skin by washing with CHG (chlorahexidine gluconate) soap before surgery.  CHG is an antiseptic cleaner which kills germs and bonds with the skin to continue killing germs even after washing. Please DO NOT use if you have an  allergy to CHG or antibacterial soaps.  If your skin becomes reddened/irritated stop using the CHG and inform your nurse when you arrive at Short Stay. Do not shave (including legs and underarms) for at least 48 hours prior to the first CHG shower.  You may shave your face/neck. Please follow these instructions carefully:  1.  Shower with CHG Soap the night before surgery and the  morning of Surgery.  2.  If you choose to wash your hair, wash your hair first as usual with your  normal  shampoo.  3.  After you shampoo, rinse your hair and body thoroughly to remove the  shampoo.                           4.  Use CHG as you would any other liquid soap.  You can apply chg directly  to the skin and wash                       Gently with a scrungie or clean washcloth.  5.  Apply the CHG Soap to your body ONLY FROM THE NECK DOWN.   Do not use on face/ open                           Wound or open sores. Avoid contact with eyes, ears mouth and genitals (private parts).                       Wash face,  Genitals (private parts) with your normal soap.             6.  Wash thoroughly, paying special attention to the area where your surgery  will be performed.  7.  Thoroughly rinse your body with warm water from the neck down.  8.  DO NOT shower/wash with your normal soap after using and rinsing off  the CHG Soap.                9.  Pat yourself dry with a clean towel.            10.  Wear clean pajamas.            11.  Place clean sheets on your bed the night of your first shower and do not  sleep with pets. Day of Surgery : Do not apply any lotions/deodorants the morning of surgery.  Please wear clean clothes to the hospital/surgery center.  FAILURE TO FOLLOW THESE INSTRUCTIONS MAY RESULT IN THE CANCELLATION OF YOUR SURGERY PATIENT SIGNATURE_________________________________  NURSE  SIGNATURE__________________________________  ________________________________________________________________________   Adam Phenix  An incentive spirometer is a tool that can help keep your lungs clear and active. This tool measures how well you are filling your lungs with each breath. Taking long deep breaths may help reverse or decrease the chance of developing breathing (pulmonary) problems (especially infection) following:  A long period of time when you are unable to move or be active. BEFORE THE PROCEDURE   If the spirometer includes an indicator to show your best effort, your nurse or respiratory therapist will set it to a desired goal.  If possible, sit up straight or lean slightly forward. Try not to slouch.  Hold the incentive spirometer in an upright position. INSTRUCTIONS FOR USE  1. Sit on the edge of your bed if possible, or sit up as far as you can in bed or on a chair. 2. Hold the incentive spirometer in an upright position. 3. Breathe out normally. 4. Place the mouthpiece in your mouth and seal your lips tightly around it. 5. Breathe in slowly and as deeply as possible, raising the piston or the ball toward the top of the column. 6. Hold your breath for 3-5 seconds or for as long as possible. Allow the piston or ball to fall to the bottom of the column. 7. Remove the mouthpiece from your mouth and breathe out normally. 8. Rest for a few seconds and repeat Steps 1 through 7 at least 10 times every 1-2 hours when you are awake. Take your time and take a few normal breaths between deep breaths. 9. The spirometer may include an indicator to show your best effort. Use the indicator as a goal to work toward during each repetition. 10. After each  set of 10 deep breaths, practice coughing to be sure your lungs are clear. If you have an incision (the cut made at the time of surgery), support your incision when coughing by placing a pillow or rolled up towels firmly  against it. Once you are able to get out of bed, walk around indoors and cough well. You may stop using the incentive spirometer when instructed by your caregiver.  RISKS AND COMPLICATIONS  Take your time so you do not get dizzy or light-headed.  If you are in pain, you may need to take or ask for pain medication before doing incentive spirometry. It is harder to take a deep breath if you are having pain. AFTER USE  Rest and breathe slowly and easily.  It can be helpful to keep track of a log of your progress. Your caregiver can provide you with a simple table to help with this. If you are using the spirometer at home, follow these instructions: [place] IF:   You are having difficultly using the spirometer.  You have trouble using the spirometer as often as instructed.  Your pain medication is not giving enough relief while using the spirometer.  You develop fever of 100.5 F (38.1 C) or higher. SEEK IMMEDIATE MEDICAL CARE IF:   You cough up bloody sputum that had not been present before.  You develop fever of 102 F (38.9 C) or greater.  You develop worsening pain at or near the incision site. MAKE SURE YOU:   Understand these instructions.  Will watch your condition.  Will get help right away if you are not doing well or get worse. Document Released: 08/20/2006 Document Revised: 07/02/2011 Document Reviewed: 10/21/2006 Exit[employer] Patient Information 2014 [employer], [place].   ________________________________________________________________________  WHAT IS A BLOOD TRANSFUSION? Blood Transfusion Information  A transfusion is the replacement of blood or some of its parts. Blood is made up of multiple cells which provide different functions.  Red blood cells carry oxygen and are used for blood loss replacement.  White blood cells fight against infection.  Platelets control bleeding.  Plasma helps clot blood.  Other blood products are available for  specialized needs, such as hemophilia or other clotting disorders. BEFORE THE TRANSFUSION  Who gives blood for transfusions?   Healthy volunteers who are fully evaluated to make sure their blood is safe. This is blood bank blood. Transfusion therapy is the safest it has ever been in the practice of medicine. Before blood is taken from a donor, a complete history is taken to make sure that person has no history of diseases nor engages in risky social behavior (examples are intravenous drug use or sexual activity with multiple partners). The donor's travel history is screened to minimize risk of transmitting infections, such as malaria. The donated blood is tested for signs of infectious diseases, such as HIV and hepatitis. The blood is then tested to be sure it is compatible with you in order to minimize the chance of a transfusion reaction. If you or a relative donates blood, this is often done in anticipation of surgery and is not appropriate for emergency situations. It takes many days to process the donated blood. RISKS AND COMPLICATIONS Although transfusion therapy is very safe and saves many lives, the main dangers of transfusion include:   Getting an infectious disease.  Developing a transfusion reaction. This is an allergic reaction to something in the blood you were given. Every precaution is taken to prevent this. The decision to have  a blood transfusion has been considered carefully by your caregiver before blood is given. Blood is not given unless the benefits outweigh the risks. AFTER THE TRANSFUSION  Right after receiving a blood transfusion, you will usually feel much better and more energetic. This is especially true if your red blood cells have gotten low (anemic). The transfusion raises the level of the red blood cells which carry oxygen, and this usually causes an energy increase.  The nurse administering the transfusion will monitor you carefully for complications. HOME CARE  INSTRUCTIONS  No special instructions are needed after a transfusion. You may find your energy is better. Speak with your caregiver about any limitations on activity for underlying diseases you may have. SEEK MEDICAL CARE IF:   Your condition is not improving after your transfusion.  You develop redness or irritation at the intravenous (IV) site. SEEK IMMEDIATE MEDICAL CARE IF:  Any of the following symptoms occur over the next 12 hours:  Shaking chills.  You have a temperature by mouth above 102 F (38.9 C), not controlled by medicine.  Chest, back, or muscle pain.  People around you feel you are not acting correctly or are confused.  Shortness of breath or difficulty breathing.  Dizziness and fainting.  You get a rash or develop hives.  You have a decrease in urine output.  Your urine turns a dark color or changes to pink, red, or brown. Any of the following symptoms occur over the next 10 days:  You have a temperature by mouth above 102 F (38.9 C), not controlled by medicine.  Shortness of breath.  Weakness after normal activity.  The white part of the eye turns yellow (jaundice).  You have a decrease in the amount of urine or are urinating less often.  Your urine turns a dark color or changes to pink, red, or brown. Document Released: 04/06/2000 Document Revised: 07/02/2011 Document Reviewed: 11/24/2007 [facility] Patient Information 2014 [place], [place].  _______________________________________________________________________

## 2016-01-11 ENCOUNTER — Encounter (HOSPITAL_COMMUNITY)
Admission: RE | Admit: 2016-01-11 | Discharge: 2016-01-11 | Disposition: A | Discharge status: Home / Self Care | Payer: Non-veteran care | Source: Ambulatory Visit | Attending: Orthopedic Surgery | Admitting: Orthopedic Surgery

## 2016-01-11 ENCOUNTER — Encounter (HOSPITAL_COMMUNITY): Payer: Self-pay

## 2016-01-11 DIAGNOSIS — R001 Bradycardia, unspecified: Secondary | ICD-10-CM | POA: Insufficient documentation

## 2016-01-11 DIAGNOSIS — I440 Atrioventricular block, first degree: Secondary | ICD-10-CM | POA: Insufficient documentation

## 2016-01-11 DIAGNOSIS — Z01812 Encounter for preprocedural laboratory examination: Secondary | ICD-10-CM | POA: Insufficient documentation

## 2016-01-11 DIAGNOSIS — Z01818 Encounter for other preprocedural examination: Secondary | ICD-10-CM | POA: Insufficient documentation

## 2016-01-11 DIAGNOSIS — I10 Essential (primary) hypertension: Secondary | ICD-10-CM | POA: Insufficient documentation

## 2016-01-11 DIAGNOSIS — Z0183 Encounter for blood typing: Secondary | ICD-10-CM | POA: Diagnosis not present

## 2016-01-11 DIAGNOSIS — M1712 Unilateral primary osteoarthritis, left knee: Secondary | ICD-10-CM | POA: Diagnosis not present

## 2016-01-11 HISTORY — DX: Essential (primary) hypertension: I10

## 2016-01-11 HISTORY — DX: Post-traumatic stress disorder, unspecified: F43.10

## 2016-01-11 HISTORY — DX: Gastro-esophageal reflux disease without esophagitis: K21.9

## 2016-01-11 HISTORY — DX: Sleep apnea, unspecified: G47.30

## 2016-01-11 HISTORY — DX: Dry eye syndrome of unspecified lacrimal gland: H04.129

## 2016-01-11 HISTORY — DX: Unspecified osteoarthritis, unspecified site: M19.90

## 2016-01-11 LAB — SURGICAL PCR SCREEN
MRSA, PCR: NEGATIVE
Staphylococcus aureus: NEGATIVE

## 2016-01-11 LAB — CBC
HCT: 37.6 % — ABNORMAL LOW (ref 39.0–52.0)
Hemoglobin: 12.9 g/dL — ABNORMAL LOW (ref 13.0–17.0)
MCH: 31.8 pg (ref 26.0–34.0)
MCHC: 34.3 g/dL (ref 30.0–36.0)
MCV: 92.6 fL (ref 78.0–100.0)
Platelets: 186 10*3/uL (ref 150–400)
RBC: 4.06 MIL/uL — ABNORMAL LOW (ref 4.22–5.81)
RDW: 11.9 % (ref 11.5–15.5)
WBC: 5 10*3/uL (ref 4.0–10.5)

## 2016-01-11 LAB — BASIC METABOLIC PANEL
Anion gap: 8 (ref 5–15)
BUN: 19 mg/dL (ref 6–20)
CO2: 28 mmol/L (ref 22–32)
Calcium: 9.2 mg/dL (ref 8.9–10.3)
Chloride: 105 mmol/L (ref 101–111)
Creatinine, Ser: 1.22 mg/dL (ref 0.61–1.24)
GFR calc Af Amer: >60 mL/min (ref >60)
GFR calc non Af Amer: 59 mL/min — ABNORMAL LOW (ref >60)
Glucose, Bld: 101 mg/dL — ABNORMAL HIGH (ref 65–99)
Potassium: 3.8 mmol/L (ref 3.5–5.1)
Sodium: 141 mmol/L (ref 135–145)

## 2016-01-11 LAB — ABO/RH: ABO/RH(D): O POS

## 2016-01-11 NOTE — Pre-Procedure Instructions (Signed)
EKG done today. Clearance(Dr. Alroy Dust) 09-08-15 with chart.

## 2016-01-17 ENCOUNTER — Inpatient Hospital Stay (HOSPITAL_COMMUNITY)
Admission: RE | Admit: 2016-01-17 | Discharge: 2016-01-20 | DRG: 470 | Disposition: A | Discharge status: Home Health | Payer: Non-veteran care | Source: Ambulatory Visit | Attending: Orthopedic Surgery | Admitting: Orthopedic Surgery

## 2016-01-17 ENCOUNTER — Inpatient Hospital Stay (HOSPITAL_COMMUNITY): Payer: Non-veteran care | Admitting: Anesthesiology

## 2016-01-17 ENCOUNTER — Encounter (HOSPITAL_COMMUNITY)
Admission: RE | Disposition: A | Discharge status: Home Health | Payer: Self-pay | Source: Ambulatory Visit | Attending: Orthopedic Surgery

## 2016-01-17 ENCOUNTER — Encounter (HOSPITAL_COMMUNITY): Payer: Self-pay | Admitting: *Deleted

## 2016-01-17 DIAGNOSIS — I440 Atrioventricular block, first degree: Secondary | ICD-10-CM | POA: Diagnosis present

## 2016-01-17 DIAGNOSIS — Z96659 Presence of unspecified artificial knee joint: Secondary | ICD-10-CM

## 2016-01-17 DIAGNOSIS — M62830 Muscle spasm of back: Secondary | ICD-10-CM | POA: Diagnosis present

## 2016-01-17 DIAGNOSIS — K219 Gastro-esophageal reflux disease without esophagitis: Secondary | ICD-10-CM | POA: Diagnosis present

## 2016-01-17 DIAGNOSIS — R262 Difficulty in walking, not elsewhere classified: Secondary | ICD-10-CM

## 2016-01-17 DIAGNOSIS — G4730 Sleep apnea, unspecified: Secondary | ICD-10-CM | POA: Diagnosis present

## 2016-01-17 DIAGNOSIS — Z823 Family history of stroke: Secondary | ICD-10-CM

## 2016-01-17 DIAGNOSIS — Z87891 Personal history of nicotine dependence: Secondary | ICD-10-CM

## 2016-01-17 DIAGNOSIS — M65862 Other synovitis and tenosynovitis, left lower leg: Secondary | ICD-10-CM | POA: Diagnosis present

## 2016-01-17 DIAGNOSIS — M25762 Osteophyte, left knee: Secondary | ICD-10-CM | POA: Diagnosis present

## 2016-01-17 DIAGNOSIS — Z6839 Body mass index (BMI) 39.0-39.9, adult: Secondary | ICD-10-CM | POA: Diagnosis not present

## 2016-01-17 DIAGNOSIS — M1712 Unilateral primary osteoarthritis, left knee: Principal | ICD-10-CM | POA: Diagnosis present

## 2016-01-17 DIAGNOSIS — I10 Essential (primary) hypertension: Secondary | ICD-10-CM | POA: Diagnosis present

## 2016-01-17 DIAGNOSIS — E669 Obesity, unspecified: Secondary | ICD-10-CM | POA: Diagnosis present

## 2016-01-17 HISTORY — DX: Presence of unspecified artificial knee joint: Z96.659

## 2016-01-17 HISTORY — PX: TOTAL KNEE ARTHROPLASTY: SHX125

## 2016-01-17 LAB — TYPE AND SCREEN
ABO/RH(D): O POS
Antibody Screen: NEGATIVE

## 2016-01-17 SURGERY — ARTHROPLASTY, KNEE, TOTAL
Anesthesia: Spinal | Site: Knee | Laterality: Left

## 2016-01-17 MED ORDER — TRAZODONE HCL 50 MG PO TABS
150.0000 mg | ORAL_TABLET | Freq: Every day | ORAL | Status: DC
  Administered 2016-01-17 – 2016-01-19 (×3): 150 mg via ORAL
  Filled 2016-01-17 (×3): qty 3

## 2016-01-17 MED ORDER — KETOROLAC TROMETHAMINE 30 MG/ML IJ SOLN
INTRAMUSCULAR | Status: AC
  Filled 2016-01-17: qty 1

## 2016-01-17 MED ORDER — METOCLOPRAMIDE HCL 5 MG PO TABS: 5.0000 mg | ORAL_TABLET | Freq: Three times a day (TID) | ORAL | Status: DC | PRN

## 2016-01-17 MED ORDER — METHOCARBAMOL 500 MG PO TABS
500.0000 mg | ORAL_TABLET | Freq: Four times a day (QID) | ORAL | Status: DC | PRN
  Administered 2016-01-17 – 2016-01-20 (×5): 500 mg via ORAL
  Filled 2016-01-17 (×5): qty 1

## 2016-01-17 MED ORDER — PANTOPRAZOLE SODIUM 40 MG PO TBEC
40.0000 mg | DELAYED_RELEASE_TABLET | Freq: Every day | ORAL | Status: DC
  Administered 2016-01-18 – 2016-01-20 (×3): 40 mg via ORAL
  Filled 2016-01-17 (×3): qty 1

## 2016-01-17 MED ORDER — LIDOCAINE 2% (20 MG/ML) 5 ML SYRINGE
INTRAMUSCULAR | Status: DC | PRN
  Administered 2016-01-17: 40 mg via INTRAVENOUS

## 2016-01-17 MED ORDER — ONDANSETRON HCL 4 MG/2ML IJ SOLN: 4.0000 mg | Freq: Four times a day (QID) | INTRAMUSCULAR | Status: DC | PRN

## 2016-01-17 MED ORDER — SODIUM CHLORIDE 0.9 % IJ SOLN
INTRAMUSCULAR | Status: AC
  Filled 2016-01-17: qty 50

## 2016-01-17 MED ORDER — PROPOFOL 10 MG/ML IV BOLUS
INTRAVENOUS | Status: DC | PRN
  Administered 2016-01-17: 20 mg via INTRAVENOUS

## 2016-01-17 MED ORDER — LIDOCAINE 2% (20 MG/ML) 5 ML SYRINGE
INTRAMUSCULAR | Status: AC
  Filled 2016-01-17: qty 5

## 2016-01-17 MED ORDER — POLYETHYLENE GLYCOL 3350 17 G PO PACK: 17.0000 g | PACK | Freq: Every day | ORAL | Status: DC | PRN

## 2016-01-17 MED ORDER — PROPOFOL 10 MG/ML IV BOLUS
INTRAVENOUS | Status: AC
  Filled 2016-01-17: qty 20

## 2016-01-17 MED ORDER — HYDROMORPHONE HCL 1 MG/ML IJ SOLN
0.5000 mg | INTRAMUSCULAR | Status: DC | PRN
Start: 2016-01-17 — End: 2016-01-20
  Administered 2016-01-19: 1 mg via INTRAVENOUS
  Filled 2016-01-17: qty 1

## 2016-01-17 MED ORDER — MENTHOL 3 MG MT LOZG: 1.0000 | LOZENGE | OROMUCOSAL | Status: DC | PRN

## 2016-01-17 MED ORDER — HYDROCODONE-ACETAMINOPHEN 7.5-325 MG PO TABS
1.0000 | ORAL_TABLET | ORAL | Status: DC | PRN
Start: 2016-01-17 — End: 2016-01-18
  Administered 2016-01-17: 1 via ORAL
  Administered 2016-01-17: 2 via ORAL
  Administered 2016-01-17: 1 via ORAL
  Administered 2016-01-18: 2 via ORAL
  Filled 2016-01-17: qty 1
  Filled 2016-01-17 (×2): qty 2
  Filled 2016-01-17: qty 1

## 2016-01-17 MED ORDER — PHENOL 1.4 % MT LIQD
1.0000 | OROMUCOSAL | Status: DC | PRN
  Filled 2016-01-17: qty 177

## 2016-01-17 MED ORDER — BENAZEPRIL HCL 10 MG PO TABS
10.0000 mg | ORAL_TABLET | Freq: Every day | ORAL | Status: DC
  Administered 2016-01-19 – 2016-01-20 (×3): 10 mg via ORAL
  Filled 2016-01-17 (×4): qty 1

## 2016-01-17 MED ORDER — DIPHENHYDRAMINE HCL 25 MG PO CAPS
25.0000 mg | ORAL_CAPSULE | Freq: Four times a day (QID) | ORAL | Status: DC
  Administered 2016-01-17 – 2016-01-20 (×4): 25 mg via ORAL
  Filled 2016-01-17 (×4): qty 1

## 2016-01-17 MED ORDER — SODIUM CHLORIDE 0.9 % IV SOLN
INTRAVENOUS | Status: DC
  Administered 2016-01-17: 16:00:00 via INTRAVENOUS

## 2016-01-17 MED ORDER — DEXAMETHASONE SODIUM PHOSPHATE 10 MG/ML IJ SOLN
INTRAMUSCULAR | Status: AC
  Filled 2016-01-17: qty 1

## 2016-01-17 MED ORDER — HYDROCODONE-ACETAMINOPHEN 7.5-325 MG PO TABS: 1.0000 | ORAL_TABLET | ORAL | 0 refills | Status: DC | PRN

## 2016-01-17 MED ORDER — CHLORHEXIDINE GLUCONATE 4 % EX LIQD: 60.0000 mL | Freq: Once | CUTANEOUS | Status: DC

## 2016-01-17 MED ORDER — ASPIRIN 81 MG PO CHEW
81.0000 mg | CHEWABLE_TABLET | Freq: Two times a day (BID) | ORAL | Status: DC
  Administered 2016-01-17 – 2016-01-20 (×6): 81 mg via ORAL
  Filled 2016-01-17 (×6): qty 1

## 2016-01-17 MED ORDER — BUPIVACAINE IN DEXTROSE 0.75-8.25 % IT SOLN
INTRATHECAL | Status: DC | PRN
  Administered 2016-01-17: 2 mL via INTRATHECAL

## 2016-01-17 MED ORDER — FENTANYL CITRATE (PF) 100 MCG/2ML IJ SOLN
INTRAMUSCULAR | Status: DC | PRN
  Administered 2016-01-17 (×2): 50 ug via INTRAVENOUS

## 2016-01-17 MED ORDER — HYDROCHLOROTHIAZIDE 12.5 MG PO CAPS
12.5000 mg | ORAL_CAPSULE | Freq: Every day | ORAL | Status: DC
Start: 2016-01-17 — End: 2016-01-20
  Administered 2016-01-19 – 2016-01-20 (×3): 12.5 mg via ORAL
  Filled 2016-01-17 (×2): qty 1

## 2016-01-17 MED ORDER — ONDANSETRON HCL 4 MG/2ML IJ SOLN
INTRAMUSCULAR | Status: AC
  Filled 2016-01-17: qty 2

## 2016-01-17 MED ORDER — ONDANSETRON HCL 4 MG PO TABS: 4.0000 mg | ORAL_TABLET | Freq: Four times a day (QID) | ORAL | Status: DC | PRN

## 2016-01-17 MED ORDER — FENTANYL CITRATE (PF) 100 MCG/2ML IJ SOLN: 25.0000 ug | INTRAMUSCULAR | Status: DC | PRN

## 2016-01-17 MED ORDER — SODIUM CHLORIDE 0.9 % IJ SOLN
INTRAMUSCULAR | Status: DC | PRN
  Administered 2016-01-17: 30 mL

## 2016-01-17 MED ORDER — KETOROLAC TROMETHAMINE 30 MG/ML IJ SOLN
INTRAMUSCULAR | Status: DC | PRN
  Administered 2016-01-17: 30 mg via INTRA_ARTICULAR

## 2016-01-17 MED ORDER — METOCLOPRAMIDE HCL 5 MG/ML IJ SOLN: 5.0000 mg | Freq: Three times a day (TID) | INTRAMUSCULAR | Status: DC | PRN

## 2016-01-17 MED ORDER — FENTANYL CITRATE (PF) 100 MCG/2ML IJ SOLN
INTRAMUSCULAR | Status: AC
  Filled 2016-01-17: qty 2

## 2016-01-17 MED ORDER — MIDAZOLAM HCL 5 MG/5ML IJ SOLN
INTRAMUSCULAR | Status: DC | PRN
  Administered 2016-01-17: 2 mg via INTRAVENOUS

## 2016-01-17 MED ORDER — CEFAZOLIN IN D5W 1 GM/50ML IV SOLN
1.0000 g | Freq: Four times a day (QID) | INTRAVENOUS | Status: AC
  Administered 2016-01-17 (×2): 1 g via INTRAVENOUS
  Filled 2016-01-17 (×2): qty 50

## 2016-01-17 MED ORDER — TRANEXAMIC ACID 1000 MG/10ML IV SOLN
1000.0000 mg | Freq: Once | INTRAVENOUS | Status: AC
  Administered 2016-01-17: 1000 mg via INTRAVENOUS
  Filled 2016-01-17: qty 10

## 2016-01-17 MED ORDER — LORATADINE 10 MG PO TABS
10.0000 mg | ORAL_TABLET | Freq: Every day | ORAL | Status: DC
  Administered 2016-01-18 – 2016-01-20 (×3): 10 mg via ORAL
  Filled 2016-01-17 (×3): qty 1

## 2016-01-17 MED ORDER — DEXAMETHASONE SODIUM PHOSPHATE 10 MG/ML IJ SOLN
10.0000 mg | Freq: Once | INTRAMUSCULAR | Status: AC
  Administered 2016-01-17: 10 mg via INTRAVENOUS

## 2016-01-17 MED ORDER — ASPIRIN 81 MG PO CHEW: 81.0000 mg | CHEWABLE_TABLET | Freq: Two times a day (BID) | ORAL | 0 refills | Status: AC

## 2016-01-17 MED ORDER — ACETAMINOPHEN 650 MG RE SUPP: 650.0000 mg | Freq: Four times a day (QID) | RECTAL | Status: DC | PRN

## 2016-01-17 MED ORDER — BUPIVACAINE HCL (PF) 0.25 % IJ SOLN
INTRAMUSCULAR | Status: AC
  Filled 2016-01-17: qty 30

## 2016-01-17 MED ORDER — VITAMIN D3 25 MCG (1000 UNIT) PO TABS
3000.0000 [IU] | ORAL_TABLET | Freq: Every day | ORAL | Status: DC
  Administered 2016-01-18 – 2016-01-20 (×3): 3000 [IU] via ORAL
  Filled 2016-01-17 (×3): qty 3

## 2016-01-17 MED ORDER — BENAZEPRIL-HYDROCHLOROTHIAZIDE 10-12.5 MG PO TABS: 1.0000 | ORAL_TABLET | Freq: Every day | ORAL | Status: DC

## 2016-01-17 MED ORDER — ACETAMINOPHEN 325 MG PO TABS: 650.0000 mg | ORAL_TABLET | Freq: Four times a day (QID) | ORAL | Status: DC | PRN

## 2016-01-17 MED ORDER — LACTATED RINGERS IV SOLN
INTRAVENOUS | Status: DC
  Administered 2016-01-17 (×4): via INTRAVENOUS

## 2016-01-17 MED ORDER — MIDAZOLAM HCL 2 MG/2ML IJ SOLN
INTRAMUSCULAR | Status: AC
  Filled 2016-01-17: qty 2

## 2016-01-17 MED ORDER — BUPIVACAINE-EPINEPHRINE (PF) 0.25% -1:200000 IJ SOLN
INTRAMUSCULAR | Status: DC | PRN
  Administered 2016-01-17: 30 mL

## 2016-01-17 MED ORDER — ONDANSETRON HCL 4 MG/2ML IJ SOLN
INTRAMUSCULAR | Status: DC | PRN
  Administered 2016-01-17: 4 mg via INTRAVENOUS

## 2016-01-17 MED ORDER — 0.9 % SODIUM CHLORIDE (POUR BTL) OPTIME
TOPICAL | Status: DC | PRN
  Administered 2016-01-17: 1000 mL

## 2016-01-17 MED ORDER — PROPOFOL 10 MG/ML IV BOLUS
INTRAVENOUS | Status: AC
  Filled 2016-01-17: qty 40

## 2016-01-17 MED ORDER — FERROUS SULFATE 325 (65 FE) MG PO TABS
325.0000 mg | ORAL_TABLET | Freq: Two times a day (BID) | ORAL | Status: DC
  Administered 2016-01-18 – 2016-01-20 (×4): 325 mg via ORAL
  Filled 2016-01-17 (×4): qty 1

## 2016-01-17 MED ORDER — ALUM & MAG HYDROXIDE-SIMETH 200-200-20 MG/5ML PO SUSP
30.0000 mL | ORAL | Status: DC | PRN
Start: 2016-01-17 — End: 2016-01-20

## 2016-01-17 MED ORDER — PROMETHAZINE HCL 25 MG/ML IJ SOLN: 6.2500 mg | INTRAMUSCULAR | Status: DC | PRN

## 2016-01-17 MED ORDER — CEFAZOLIN SODIUM-DEXTROSE 2-4 GM/100ML-% IV SOLN
2.0000 g | INTRAVENOUS | Status: AC
  Administered 2016-01-17: 2 g via INTRAVENOUS

## 2016-01-17 MED ORDER — CEFAZOLIN SODIUM-DEXTROSE 2-4 GM/100ML-% IV SOLN
INTRAVENOUS | Status: AC
  Filled 2016-01-17: qty 100

## 2016-01-17 MED ORDER — METHOCARBAMOL 500 MG PO TABS: 500.0000 mg | ORAL_TABLET | Freq: Four times a day (QID) | ORAL | 0 refills | Status: DC | PRN

## 2016-01-17 MED ORDER — TRANEXAMIC ACID 1000 MG/10ML IV SOLN
1000.0000 mg | INTRAVENOUS | Status: AC
  Administered 2016-01-17: 1000 mg via INTRAVENOUS
  Filled 2016-01-17: qty 1100

## 2016-01-17 MED ORDER — METHOCARBAMOL 1000 MG/10ML IJ SOLN
500.0000 mg | Freq: Four times a day (QID) | INTRAVENOUS | Status: DC | PRN
  Filled 2016-01-17: qty 5

## 2016-01-17 MED ORDER — PROPOFOL 500 MG/50ML IV EMUL
INTRAVENOUS | Status: DC | PRN
  Administered 2016-01-17: 50 ug/kg/min via INTRAVENOUS

## 2016-01-17 MED ORDER — DIPHENHYDRAMINE HCL 25 MG PO TABS
25.0000 mg | ORAL_TABLET | Freq: Four times a day (QID) | ORAL | Status: DC
  Filled 2016-01-17: qty 1

## 2016-01-17 MED ORDER — DOCUSATE SODIUM 100 MG PO CAPS
100.0000 mg | ORAL_CAPSULE | Freq: Two times a day (BID) | ORAL | Status: DC
  Administered 2016-01-17 – 2016-01-20 (×6): 100 mg via ORAL
  Filled 2016-01-17 (×6): qty 1

## 2016-01-17 MED ORDER — DEXAMETHASONE SODIUM PHOSPHATE 10 MG/ML IJ SOLN
10.0000 mg | Freq: Once | INTRAMUSCULAR | Status: AC
  Administered 2016-01-18: 10 mg via INTRAVENOUS
  Filled 2016-01-17: qty 1

## 2016-01-17 SURGICAL SUPPLY — 44 items
BAG DECANTER FOR FLEXI CONT (MISCELLANEOUS) IMPLANT
BAG SPEC THK2 15X12 ZIP CLS (MISCELLANEOUS)
BAG ZIPLOCK 12X15 (MISCELLANEOUS) IMPLANT
BANDAGE ACE 6X5 VEL STRL LF (GAUZE/BANDAGES/DRESSINGS) ×2 IMPLANT
BLADE SAW SGTL 13.0X1.19X90.0M (BLADE) ×2 IMPLANT
BOWL SMART MIX CTS (DISPOSABLE) ×2 IMPLANT
CAPT KNEE TOTAL 3 ATTUNE ×1 IMPLANT
CEMENT HV SMART SET (Cement) ×2 IMPLANT
CLOTH BEACON ORANGE TIMEOUT ST (SAFETY) ×2 IMPLANT
CUFF TOURN SGL QUICK 34 (TOURNIQUET CUFF) ×2
CUFF TRNQT CYL 34X4X40X1 (TOURNIQUET CUFF) ×1 IMPLANT
DECANTER SPIKE VIAL GLASS SM (MISCELLANEOUS) ×1 IMPLANT
DRAPE U-SHAPE 47X51 STRL (DRAPES) ×2 IMPLANT
DRESSING AQUACEL AG SP 3.5X10 (GAUZE/BANDAGES/DRESSINGS) ×1 IMPLANT
DRSG AQUACEL AG SP 3.5X10 (GAUZE/BANDAGES/DRESSINGS) ×2
DURAPREP 26ML APPLICATOR (WOUND CARE) ×4 IMPLANT
ELECT REM PT RETURN 9FT ADLT (ELECTROSURGICAL) ×2
ELECTRODE REM PT RTRN 9FT ADLT (ELECTROSURGICAL) ×1 IMPLANT
GLOVE BIOGEL M 7.0 STRL (GLOVE) IMPLANT
GLOVE BIOGEL PI IND STRL 7.5 (GLOVE) ×1 IMPLANT
GLOVE BIOGEL PI IND STRL 8.5 (GLOVE) ×1 IMPLANT
GLOVE BIOGEL PI INDICATOR 7.5 (GLOVE) ×1
GLOVE BIOGEL PI INDICATOR 8.5 (GLOVE)
GLOVE ECLIPSE 8.0 STRL XLNG CF (GLOVE) ×2 IMPLANT
GLOVE ORTHO TXT STRL SZ7.5 (GLOVE) ×4 IMPLANT
GOWN STRL REUS W/TWL LRG LVL3 (GOWN DISPOSABLE) ×2 IMPLANT
GOWN STRL REUS W/TWL XL LVL3 (GOWN DISPOSABLE) ×2 IMPLANT
HANDPIECE INTERPULSE COAX TIP (DISPOSABLE) ×2
LIQUID BAND (GAUZE/BANDAGES/DRESSINGS) ×2 IMPLANT
MANIFOLD NEPTUNE II (INSTRUMENTS) ×2 IMPLANT
PACK TOTAL KNEE CUSTOM (KITS) ×2 IMPLANT
POSITIONER SURGICAL ARM (MISCELLANEOUS) ×2 IMPLANT
SET HNDPC FAN SPRY TIP SCT (DISPOSABLE) ×1 IMPLANT
SET PAD KNEE POSITIONER (MISCELLANEOUS) ×2 IMPLANT
SUT MNCRL AB 4-0 PS2 18 (SUTURE) ×2 IMPLANT
SUT VIC AB 1 CT1 36 (SUTURE) ×2 IMPLANT
SUT VIC AB 2-0 CT1 27 (SUTURE) ×6
SUT VIC AB 2-0 CT1 TAPERPNT 27 (SUTURE) ×3 IMPLANT
SUT VLOC 180 0 24IN GS25 (SUTURE) ×2 IMPLANT
SYR 50ML LL SCALE MARK (SYRINGE) ×2 IMPLANT
TRAY FOLEY W/METER SILVER 16FR (SET/KITS/TRAYS/PACK) ×2 IMPLANT
WATER STERILE IRR 1500ML POUR (IV SOLUTION) ×2 IMPLANT
WRAP KNEE MAXI GEL POST OP (GAUZE/BANDAGES/DRESSINGS) ×2 IMPLANT
YANKAUER SUCT BULB TIP 10FT TU (MISCELLANEOUS) ×2 IMPLANT

## 2016-01-17 NOTE — Anesthesia Preprocedure Evaluation (Addendum)
Anesthesia Evaluation  Patient identified by MRN, date of birth, ID band Patient awake    Reviewed: Allergy & Precautions, NPO status , Patient's Chart, lab work & pertinent test results  History of Anesthesia Complications Negative for: history of anesthetic complications  Airway Mallampati: II  TM Distance: >3 FB Neck ROM: Full    Dental  (+) Teeth Intact, Dental Advisory Given   Pulmonary neg shortness of breath, sleep apnea and Continuous Positive Airway Pressure Ventilation , neg COPD, neg recent URI, former smoker,    Pulmonary exam normal breath sounds clear to auscultation       Cardiovascular hypertension, Pt. 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)  Rhythm:Regular Rate:Normal  EKG 01/11/2016: Sinus bradycardia with 1st degree AV block   Neuro/Psych PSYCHIATRIC DISORDERS (PTSD) negative neurological ROS     GI/Hepatic Neg liver ROS, GERD  Medicated and Controlled,  Endo/Other  neg diabetesMorbid obesity  Renal/GU negative Renal ROS     Musculoskeletal  (+) Arthritis , Osteoarthritis,    Abdominal (+) + obese,   Peds  Hematology negative hematology ROS (+)   Anesthesia Other Findings   Reproductive/Obstetrics                            Anesthesia Physical Anesthesia Plan  ASA: III  Anesthesia Plan: Spinal   Post-op Pain Management:    Induction: Intravenous  Airway Management Planned: Natural Airway and Simple Face Mask  Additional Equipment:   Intra-op Plan:   Post-operative Plan:   Informed Consent: I have reviewed the patients History and Physical, chart, labs and discussed the procedure including the risks, benefits and alternatives for the proposed anesthesia with the patient or authorized representative who has indicated his/her understanding and acceptance.   Dental advisory given  Plan  Discussed with: CRNA  Anesthesia Plan Comments: (I have discussed risks of neuraxial anesthesia including but not limited to infection, bleeding, nerve injury, back pain, headache, seizures, and failure of block. Patient denies bleeding disorders and is not currently anticoagulated. Labs have been reviewed. Risks and benefits discussed. All patient's questions answered.   Platelets 186)       Anesthesia Quick Evaluation

## 2016-01-17 NOTE — Anesthesia Procedure Notes (Signed)
Spinal  Patient location during procedure: OR Start time: 01/17/2016 10:58 AM End time: 01/17/2016 11:01 AM Reason for block: at surgeon's request Staffing Anesthesiologist: Nilda Simmer Performed: anesthesiologist  Preanesthetic Checklist Completed: patient identified, surgical consent, pre-op evaluation, timeout performed, IV checked, risks and benefits discussed and monitors and equipment checked Spinal Block Patient position: sitting Prep: ChloraPrep Patient monitoring: heart rate, cardiac monitor, continuous pulse ox and blood pressure Approach: midline Location: L2-3 Injection technique: single-shot Needle Needle type: Pencan  Needle gauge: 24 G Needle length: 9 cm

## 2016-01-17 NOTE — Anesthesia Postprocedure Evaluation (Signed)
Anesthesia Post Note  Patient: Larson Raitt  Procedure(s) Performed: Procedure(s) (LRB): LEFT TOTAL KNEE ARTHROPLASTY (Left)  Patient location during evaluation: PACU Anesthesia Type: Spinal Level of consciousness: oriented and awake and alert Pain management: pain level controlled Vital Signs Assessment: post-procedure vital signs reviewed and stable Respiratory status: spontaneous breathing, respiratory function stable and nonlabored ventilation Cardiovascular status: blood pressure returned to baseline and stable Postop Assessment: no headache and no backache Anesthetic complications: no    Last Vitals:  Vitals:   01/17/16 1315 01/17/16 1330  BP: 127/76 132/78  Pulse: (!) 53 (!) 41  Resp: 16 19  Temp:  36.6 C    Last Pain:  Vitals:   01/17/16 1330  TempSrc:   PainSc: 0-No pain                 Nilda Simmer

## 2016-01-17 NOTE — Interval H&P Note (Signed)
History and Physical Interval Note:  01/17/2016 9:57 AM  Joseph Khan  has presented today for surgery, with the diagnosis of LEFT KNEE OA  The various methods of treatment have been discussed with the patient and family. After consideration of risks, benefits and other options for treatment, the patient has consented to  Procedure(s): LEFT TOTAL KNEE ARTHROPLASTY (Left) as a surgical intervention .  The patient's history has been reviewed, patient examined, no change in status, stable for surgery.  I have reviewed the patient's chart and labs.  Questions were answered to the patient's satisfaction.     Mauri Pole

## 2016-01-17 NOTE — Op Note (Signed)
NAME:  Joseph Khan                      MEDICAL RECORD NO.:  [id number]                             FACILITY:  [facility]      PHYSICIAN:  Pietro Cassis. Alvan Dame, M.D.  DATE OF BIRTH:  1945/05/24      DATE OF PROCEDURE:  01/17/2016                                     OPERATIVE REPORT         PREOPERATIVE DIAGNOSIS:  Left knee osteoarthritis.      POSTOPERATIVE DIAGNOSIS:  Left knee osteoarthritis.      FINDINGS:  The patient was noted to have complete loss of cartilage and   bone-on-bone arthritis with associated osteophytes in the medial and patellofemoral compartments of   the knee with a significant synovitis and associated effusion.      PROCEDURE:  Left total knee replacement.      COMPONENTS USED:  DePuy Attune rotating platform posterior stabilized knee   system, a size 6N femur, 6 tibia, size 6 PS AOX insert, and 41 anatomic patellar   button.      SURGEON:  Pietro Cassis. Alvan Dame, M.D.      ASSISTANT:  Molli Barrows, PA-C.      ANESTHESIA:  Spinal.      SPECIMENS:  None.      COMPLICATION:  None.      DRAINS:  None.  EBL: <100cc      TOURNIQUET TIME:   Total Tourniquet Time Documented: Thigh (Left) - 27 minutes Total: Thigh (Left) - 27 minutes  .      The patient was stable to the recovery room.      INDICATION FOR PROCEDURE:  Joseph Khan is a 70 y.o. male patient of   mine.  The patient had been seen, evaluated, and treated conservatively in the   office with medication, activity modification, and injections.  The patient had   radiographic changes of bone-on-bone arthritis with endplate sclerosis and osteophytes noted.      The patient failed conservative measures including medication, injections, and activity modification, and at this point was ready for more definitive measures.   Based on the radiographic changes and failed conservative measures, the patient   decided to proceed with total knee replacement.  Risks of infection,   DVT, component failure, need for  revision surgery, postop course, and   expectations were all   discussed and reviewed.  Consent was obtained for benefit of pain   relief.      PROCEDURE IN DETAIL:  The patient was brought to the operative theater.   Once adequate anesthesia, preoperative antibiotics, 2 gm of Ancef, 1 gm of Tranexamic ACid, and 10 mg of Decadron administered, the patient was positioned supine with the left thigh tourniquet placed.  The  left lower extremity was prepped and draped in sterile fashion.  A time-   out was performed identifying the patient, planned procedure, and   extremity.      The left lower extremity was placed in the [facility] leg holder.  The leg was   exsanguinated, tourniquet elevated to 250 mmHg.  A midline incision was   made followed by median  parapatellar arthrotomy.  Following initial   exposure, attention was first directed to the patella.  Precut   measurement was noted to be 25 mm.  I resected down to 14 mm and used a   41 anatomic patellar button to restore patellar height as well as cover the cut   surface.      The lug holes were drilled and a metal shim was placed to protect the   patella from retractors and saw blades.      At this point, attention was now directed to the femur.  The femoral   canal was opened with a drill, irrigated to try to prevent fat emboli.  An   intramedullary rod was passed at 5 degrees valgus, 9 mm of bone was   resected off the distal femur.  Following this resection, the tibia was   subluxated anteriorly.  Using the extramedullary guide, 2 mm of bone was resected off   the proximal medial tibia.  We confirmed the gap would be   stable medially and laterally with a size 5 spacer gap as well as confirmed   the cut was perpendicular in the coronal plane, checking with an alignment rod.      Once this was done, I sized the femur to be a size 6 in the anterior-   posterior dimension, chose a narrow component based on medial and   lateral  dimension.  The size 6 rotation block was then pinned in   position anterior referenced using the C-clamp to set rotation.  The   anterior, posterior, and  chamfer cuts were made without difficulty nor   notching making certain that I was along the anterior cortex to help   with flexion gap stability.      The final box cut was made off the lateral aspect of distal femur.      At this point, the tibia was sized to be a size 6, the size 6 tray was   then pinned in position through the medial third of the tubercle,   drilled, and keel punched.  Trial reduction was now carried with a 6 femur,  6 tibia, a size 5 then 6 PS insert, and the 41 anatomic patella botton.  The knee was brought to   extension, full extension with good flexion stability with the patella   tracking through the trochlea without application of pressure.  Given   all these findings the femoral lug holes were drilled and then the trial components removed.  Final components were   opened and cement was mixed.  The knee was irrigated with normal saline   solution and pulse lavage.  The synovial lining was   then injected with 30 cc of 0.25% Marcaine with epinephrine and 1 cc of Toradol plus 30 cc of NS for a   total of 61 cc.      The knee was irrigated.  Final implants were then cemented onto clean and   dried cut surfaces of bone with the knee brought to extension with a size 6 PS trial insert.      Once the cement had fully cured, the excess cement was removed   throughout the knee.  I confirmed I was satisfied with the range of   motion and stability, and the final size 6 PS AOX insert was chosen.  It was   placed into the knee.      The tourniquet had been let down at 27 minutes.  No significant   hemostasis required.  The   extensor mechanism was then reapproximated using #1 Vicryl and #0 V-lock sutures with the knee   in flexion.  The   remaining wound was closed with 2-0 Vicryl and running 4-0 Monocryl.   The  knee was cleaned, dried, dressed sterilely using Dermabond and   Aquacel dressing.  The patient was then   brought to recovery room in stable condition, tolerating the procedure   well.   Please note that Physician Assistant, Molli Barrows, PA-C, was present for the entirety of the case, and was utilized for pre-operative positioning, peri-operative retractor management, general facilitation of the procedure.  He was also utilized for primary wound closure at the end of the case.              Pietro Cassis Alvan Dame, M.D.    01/17/2016 1:53 PM

## 2016-01-17 NOTE — Evaluation (Signed)
Physical Therapy Evaluation Patient Details Name: Joseph Khan MRN: [id number] DOB: Oct 20, 1945 Today's Date: 01/17/2016   History of Present Illness  s/ L TKA  Clinical Impression  Pt is s/p TKA resulting in the deficits listed below (see PT Problem List). * Pt will benefit from skilled PT to increase their independence and safety with mobility to allow discharge to the venue listed below.  Pt min assist with bed mobility, deferred transfer to chair as pt stil with some numbness which incr with sitting; pt sat EOB x 28min; should progress well     Follow Up Recommendations Home health PT;Outpatient PT    Equipment Recommendations  Rolling walker with 5" wheels    Recommendations for Other Services       Precautions / Restrictions Precautions Precautions: Fall;Knee Restrictions Weight Bearing Restrictions: No Other Position/Activity Restrictions: WBAT      Mobility  Bed Mobility Overal bed mobility: Needs Assistance Bed Mobility: Supine to Sit     Supine to sit: Min assist     General bed mobility comments: assist with LLE  Transfers                    Ambulation/Gait                Stairs            Wheelchair Mobility    Modified Rankin (Stroke Patients Only)       Balance                                             Pertinent Vitals/Pain Pain Assessment: 0-10 Pain Score: 5  Pain Location: left knee Pain Descriptors / Indicators: Sore Pain Intervention(s): Limited activity within patient's tolerance;Monitored during session;Premedicated before session;Repositioned    Home Living Family/patient expects to be discharged to:: Private residence Living Arrangements: Spouse/significant other;Alone Available Help at Discharge: Family Type of Home: House Home Access: Stairs to enter   Technical brewer of Steps: 1 Home Layout: Two level;Able to live on main level with bedroom/bathroom Home Equipment: Bedside  commode      Prior Function Level of Independence: Independent               Hand Dominance        Extremity/Trunk Assessment   Upper Extremity Assessment: Defer to OT evaluation;Overall [facility] for tasks assessed           Lower Extremity Assessment: LLE deficits/detail   LLE Deficits / Details: ankle WFL; knee extension and hip flexion 2 to 2+/5, expected postop weakness; knee flexion to 70* in sitting     Communication   Communication: No difficulties  Cognition Arousal/Alertness: Awake/alert Behavior During Therapy: WFL for tasks assessed/performed Overall Cognitive Status: Within Functional Limits for tasks assessed                      General Comments      Exercises Total Joint Exercises Ankle Circles/Pumps: AROM;Both Long Arc Quad: AROM;Left;10 reps (limited ROM d/t pain)   Assessment/Plan    PT Assessment Patient needs continued PT services  PT Problem List            PT Treatment Interventions DME instruction;Gait training;Functional mobility training;Therapeutic activities;Therapeutic exercise;Patient/family education    PT Goals (Current goals can be found in the Care Plan section)  Acute Rehab PT Goals Patient  Stated Goal: less knee pain PT Goal Formulation: With patient Time For Goal Achievement: 01/20/16 Potential to Achieve Goals: Good    Frequency 7X/week   Barriers to discharge        Co-evaluation               End of Session   Activity Tolerance: Patient tolerated treatment well;Other (comment) (gluteal area and ) Patient left: with call bell/phone within reach;in bed;with bed alarm set;with family/visitor present Nurse Communication: Mobility status         Time: [id number] PT Time Calculation (min) (ACUTE ONLY): 26 min   Charges:   PT Evaluation $PT Eval Low Complexity: 1 Procedure PT Treatments $Therapeutic Activity: 8-22 mins   PT G Codes:        Niv Darley 2016-02-07, 5:23 PM

## 2016-01-17 NOTE — Transfer of Care (Signed)
Immediate Anesthesia Transfer of Care Note  Patient: Joseph Khan  Procedure(s) Performed: Procedure(s): LEFT TOTAL KNEE ARTHROPLASTY (Left)  Patient Location: PACU  Anesthesia Type:Spinal  Level of Consciousness:  sedated, patient cooperative and responds to stimulation  Airway & Oxygen Therapy:Patient Spontanous Breathing and Patient connected to face mask oxgen  Post-op Assessment:  Report given to PACU RN and Post -op Vital signs reviewed and stable  Post vital signs:  Reviewed and stable  Last Vitals:  Vitals:   01/17/16 0851  BP: 140/71  Pulse: (!) 59  Resp: 18  Temp: [id number] C    Complications: No apparent anesthesia complications

## 2016-01-18 LAB — CBC
HCT: 33.7 % — ABNORMAL LOW (ref 39.0–52.0)
Hemoglobin: 11.8 g/dL — ABNORMAL LOW (ref 13.0–17.0)
MCH: 32.2 pg (ref 26.0–34.0)
MCHC: 35 g/dL (ref 30.0–36.0)
MCV: 92.1 fL (ref 78.0–100.0)
Platelets: 175 10*3/uL (ref 150–400)
RBC: 3.66 MIL/uL — ABNORMAL LOW (ref 4.22–5.81)
RDW: 11.9 % (ref 11.5–15.5)
WBC: 10.7 10*3/uL — ABNORMAL HIGH (ref 4.0–10.5)

## 2016-01-18 LAB — BASIC METABOLIC PANEL
Anion gap: 8 (ref 5–15)
BUN: 27 mg/dL — ABNORMAL HIGH (ref 6–20)
CO2: 24 mmol/L (ref 22–32)
Calcium: 8.4 mg/dL — ABNORMAL LOW (ref 8.9–10.3)
Chloride: 107 mmol/L (ref 101–111)
Creatinine, Ser: 1.37 mg/dL — ABNORMAL HIGH (ref 0.61–1.24)
GFR calc Af Amer: 60 mL/min — ABNORMAL LOW (ref >60)
GFR calc non Af Amer: 51 mL/min — ABNORMAL LOW (ref >60)
Glucose, Bld: 152 mg/dL — ABNORMAL HIGH (ref 65–99)
Potassium: 4.1 mmol/L (ref 3.5–5.1)
Sodium: 139 mmol/L (ref 135–145)

## 2016-01-18 MED ORDER — ACETAMINOPHEN 500 MG PO TABS
1000.0000 mg | ORAL_TABLET | Freq: Three times a day (TID) | ORAL | Status: DC | PRN
  Administered 2016-01-20: 1000 mg via ORAL
  Filled 2016-01-18: qty 2

## 2016-01-18 MED ORDER — OXYCODONE HCL 5 MG PO TABS
5.0000 mg | ORAL_TABLET | ORAL | Status: DC | PRN
Start: 2016-01-18 — End: 2016-01-20
  Administered 2016-01-18 (×4): 10 mg via ORAL
  Administered 2016-01-19 (×2): 15 mg via ORAL
  Administered 2016-01-19: 10 mg via ORAL
  Administered 2016-01-19: 15 mg via ORAL
  Administered 2016-01-19: 10 mg via ORAL
  Administered 2016-01-20: 15 mg via ORAL
  Filled 2016-01-18: qty 3
  Filled 2016-01-18: qty 2
  Filled 2016-01-18: qty 3
  Filled 2016-01-18 (×4): qty 2
  Filled 2016-01-18: qty 3
  Filled 2016-01-18 (×2): qty 2
  Filled 2016-01-18: qty 3

## 2016-01-18 MED ORDER — OXYCODONE HCL 5 MG PO TABS: 5.0000 mg | ORAL_TABLET | ORAL | 0 refills | Status: DC | PRN

## 2016-01-18 NOTE — Care Management Note (Signed)
Case Management Note  Patient Details  Name: Joseph Khan MRN: [id number] Date of Birth: 09-23-1945  Subjective/Objective:                  LEFT TOTAL KNEE ARTHROPLASTY (Left) Action/Plan: Discharge planning Expected Discharge Date:  01/18/16               Expected Discharge Plan:  [place]  In-House Referral:     Discharge planning Services  CM Consult  Post Acute Care Choice:  Home Health Choice offered to:  Spouse  DME Arranged:  [place] [facility] rolling DME Agency:  [place]:  PT [place] Agency:  [facility] (formerly [facility])  Status of Service:  Completed, signed off  If discussed at [employer], dates discussed:    Additional Comments: CM met with pt in room to offer choice of home health agency.  Pt presurgically set up with [facility] and is [employer].  Referral given to [facility] rep, Tim.  Cm notified [place] DME rep, Jermaine to please deliver the rolling walker to room prior to discharge.  Pt states he has a 3n1 at home.  No other CM needs were communicated. Dellie Catholic, RN 01/18/2016, 9:43 AM

## 2016-01-18 NOTE — Progress Notes (Signed)
Physical Therapy Treatment Patient Details Name: Joseph Khan MRN: [id number] DOB: [birth year] Today's Date: 01/18/2016    History of Present Illness s/ L TKA 01/17/16.     PT Comments    Progressing well with mobility.   Follow Up Recommendations        Equipment Recommendations  Rolling walker with 5" wheels    Recommendations for Other Services       Precautions / Restrictions Precautions Precautions: Fall;Knee Restrictions Weight Bearing Restrictions: No Other Position/Activity Restrictions: WBAT    Mobility  Bed Mobility Overal bed mobility: Needs Assistance Bed Mobility: Supine to Sit;Sit to Supine     Supine to sit: Min assist Sit to supine: Min assist   General bed mobility comments: small amount of assist for L LE  Transfers Overall transfer level: Needs assistance Equipment used: Rolling walker (2 wheeled) Transfers: Sit to/from Stand Sit to Stand: Min guard         General transfer comment: close guard for safety.   Ambulation/Gait Ambulation/Gait assistance: Min guard Ambulation Distance (Feet): 175 Feet Assistive device: Rolling walker (2 wheeled) Gait Pattern/deviations: Step-through pattern;Step-to pattern;Decreased stride length     General Gait Details: close guard for safety.    Stairs            Wheelchair Mobility    Modified Rankin (Stroke Patients Only)       Balance                                    Cognition Arousal/Alertness: Awake/alert Behavior During Therapy: WFL for tasks assessed/performed Overall Cognitive Status: Within Functional Limits for tasks assessed                      Exercises      General Comments        Pertinent Vitals/Pain Pain Assessment: 0-10 Pain Score: 7  Pain Location: L knee Pain Descriptors / Indicators: Aching;Sore Pain Intervention(s): Monitored during session;Ice applied    Home Living                      Prior Function            PT Goals (current goals can now be found in the care plan section) Progress towards PT goals: Progressing toward goals    Frequency    7X/week      PT Plan Current plan remains appropriate    Co-evaluation             End of Session   Activity Tolerance: Patient tolerated treatment well Patient left: in bed;with call bell/phone within reach;with family/visitor present     Time: [id number] PT Time Calculation (min) (ACUTE ONLY): 26 min  Charges:  $Gait Training: 23-37 mins                    G Codes:      [name], MPT Pager: [phone]

## 2016-01-18 NOTE — Clinical Social Work Note (Signed)
CSW was consulted for SNF placement.  Per chart review patient is going home with home health.  CSW signing off.  Dede Query, LCSW [place] Worker - Weekend Coverage cell #: [phone]

## 2016-01-18 NOTE — Progress Notes (Signed)
Physical Therapy Treatment Patient Details Name: Joseph Khan MRN: [id number] DOB: 08-Nov-1945 Today's Date: 01/18/2016    History of Present Illness s/ L TKA 01/17/16.     PT Comments    Progressing with mobility. Pain rated 7/10 during session.   Follow Up Recommendations        Equipment Recommendations  Rolling walker with 5" wheels    Recommendations for Other Services       Precautions / Restrictions Precautions Precautions: Fall;Knee Restrictions Weight Bearing Restrictions: No Other Position/Activity Restrictions: WBAT    Mobility  Bed Mobility Overal bed mobility: Needs Assistance Bed Mobility: Supine to Sit     Supine to sit: Min assist     General bed mobility comments: assist with LLE  Transfers Overall transfer level: Needs assistance Equipment used: Rolling walker (2 wheeled) Transfers: Sit to/from Stand Sit to Stand: Min assist         General transfer comment: Assist to stabilize. VCs safety, technique, hand/LE placement.  Ambulation/Gait Ambulation/Gait assistance: Min assist Ambulation Distance (Feet): 125 Feet Assistive device: Rolling walker (2 wheeled) Gait Pattern/deviations: Step-to pattern     General Gait Details: VCs safety, technique, distance from RW, step length. Slow gait speed. Pt insisted on walking long distance.    Stairs            Wheelchair Mobility    Modified Rankin (Stroke Patients Only)       Balance                                    Cognition Arousal/Alertness: Awake/alert Behavior During Therapy: WFL for tasks assessed/performed Overall Cognitive Status: Within Functional Limits for tasks assessed                      Exercises Total Joint Exercises Ankle Circles/Pumps: AROM;Both;10 reps;Supine Quad Sets: AROM;Both;10 reps;Supine Hip ABduction/ADduction: AAROM;Left;10 reps;Supine Straight Leg Raises: AAROM;Left;10 reps;Supine Knee Flexion: AAROM;Left;10  reps;Seated Goniometric ROM: ~10-70 degrees    General Comments        Pertinent Vitals/Pain Pain Assessment: 0-10 Pain Score: 5  Pain Location: L knee Pain Descriptors / Indicators: Sore;Aching Pain Intervention(s): Repositioned;Ice applied    Home Living                      Prior Function            PT Goals (current goals can now be found in the care plan section) Progress towards PT goals: Progressing toward goals    Frequency    7X/week      PT Plan Current plan remains appropriate    Co-evaluation             End of Session Equipment Utilized During Treatment: Gait belt Activity Tolerance: Patient tolerated treatment well Patient left: in chair;with call bell/phone within reach;with family/visitor present     Time: [id number] PT Time Calculation (min) (ACUTE ONLY): 25 min  Charges:  $Gait Training: 8-22 mins $Therapeutic Exercise: 8-22 mins                    G Codes:      [name], MPT Pager: [phone]

## 2016-01-18 NOTE — Progress Notes (Signed)
Occupational Therapy Evaluation Patient Details Name: Joseph Khan MRN: [id number] DOB: 1945/12/10 Today's Date: 01/18/2016    History of Present Illness s/ L TKA 01/17/16.    Clinical Impression   Patient presents to OT with decreased ADL independence and safety due to the deficits listed below. Will benefit from skilled OT to maximize function and to facilitate a safe discharge. OT will follow.     Follow Up Recommendations  No OT follow up    Equipment Recommendations  None recommended by OT    Recommendations for Other Services       Precautions / Restrictions Precautions Precautions: Fall;Knee Restrictions Weight Bearing Restrictions: No Other Position/Activity Restrictions: WBAT      Mobility Bed Mobility            General bed mobility comments: NT -- OOB in recliner  Transfers Overall transfer level: Needs assistance Equipment used: Rolling walker (2 wheeled) Transfers: Sit to/from Stand Sit to Stand: Min guard         General transfer comment: cues technique    Balance                                            ADL Overall ADL's : Needs assistance/impaired     Grooming: Wash/dry hands;Min Dispensing optician: Min guard;Ambulation;BSC;RW   Toileting- Water quality scientist and Hygiene: Min guard;Sit to/from stand   Tub/ Shower Transfer: Walk-in shower;Min guard;Ambulation;Rolling walker;3 in 1   Functional mobility during ADLs: Min guard;Rolling walker General ADL Comments: Patient received up in recliner. Patient ambulated with RW to bathroom, practiced toilet and shower transfers, then ambulated back to recliner. Discussed use of 3 in 1 in shower vs obtaining one through the [place]. Wife presents and reports she can assist with ADLs as needed.     Vision     Perception     Praxis      Pertinent Vitals/Pain Pain Assessment: 0-10 Pain Score: 7  Pain Location: L knee Pain  Descriptors / Indicators: Aching;Sore Pain Intervention(s): Monitored during session;Repositioned;Ice applied     Hand Dominance     Extremity/Trunk Assessment Upper Extremity Assessment Upper Extremity Assessment: Overall WFL for tasks assessed   Lower Extremity Assessment Lower Extremity Assessment: Defer to PT evaluation       Communication Communication Communication: No difficulties   Cognition Arousal/Alertness: Awake/alert Behavior During Therapy: WFL for tasks assessed/performed Overall Cognitive Status: Within Functional Limits for tasks assessed                     General Comments       Exercises       Shoulder Instructions      Home Living Family/patient expects to be discharged to:: Private residence Living Arrangements: Spouse/significant other;Alone Available Help at Discharge: Family Type of Home: House Home Access: Stairs to enter Technical brewer of Steps: 1   Home Layout: Two level;Able to live on main level with bedroom/bathroom     Bathroom Shower/Tub: Occupational psychologist: Standard Bathroom Accessibility: Yes How Accessible: Accessible via walker Home Equipment: Bedside commode   Additional Comments: uses 3 in 1 in shower too      Prior Functioning/Environment Level of Independence: Independent  OT Problem List: Decreased strength;Decreased range of motion;Decreased knowledge of use of DME or AE;Pain   OT Treatment/Interventions: Self-care/ADL training;DME and/or AE instruction;Therapeutic activities;Patient/family education    OT Goals(Current goals can be found in the care plan section) Acute Rehab OT Goals Patient Stated Goal: home tomorrow OT Goal Formulation: With patient Time For Goal Achievement: 02/01/16 Potential to Achieve Goals: Good ADL Goals Pt Will Perform Lower Body Bathing: with min assist;sit to/from stand Pt Will Perform Lower Body Dressing: with min assist;sit  to/from stand  OT Frequency: Min 2X/week   Barriers to D/C:            Co-evaluation              End of Session Equipment Utilized During Treatment: Rolling walker  Activity Tolerance: Patient tolerated treatment well Patient left: in chair;with call bell/phone within reach;with chair alarm set;with family/visitor present   Time: [id number] OT Time Calculation (min): 14 min Charges:  OT General Charges $OT Visit: 1 Procedure OT Evaluation $OT Eval Low Complexity: 1 Procedure G-Codes:    Joseph Khan A 2016-02-13, 11:58 AM

## 2016-01-18 NOTE — Progress Notes (Signed)
Patient ID: Joseph Khan, male   DOB: November 05, 1945, 70 y.o.   MRN: [id number]    Subjective: 1 Day Post-Op Procedure(s) (LRB): LEFT TOTAL KNEE ARTHROPLASTY (Left)    Patient reports pain as moderate.  Restless night, didn't feel meds provided enough coverage time wise  Objective:   VITALS:   Vitals:   01/18/16 0146 01/18/16 0624  BP: (!) 107/47 (!) 125/51  Pulse: 78 (!) 50  Resp: 16 18  Temp: 97.9 F (36.6 C) 97.8 F (36.6 C)    Neurovascular intact Incision: dressing C/D/I  LABS  Recent Labs  01/18/16 0454  HGB 11.8*  HCT 33.7*  WBC 10.7*  PLT 175     Recent Labs  01/18/16 0454  NA 139  K 4.1  BUN 27*  CREATININE 1.37*  GLUCOSE 152*    No results for input(s): LABPT, INR in the last 72 hours.   Assessment/Plan: 1 Day Post-Op Procedure(s) (LRB): LEFT TOTAL KNEE ARTHROPLASTY (Left)   Advance diet Up with therapy Plan for discharge tomorrow   Will change pain meds to oxycodone to see if he tolerates the meds and if gives him longer relief If does well then home tomorrow

## 2016-01-19 NOTE — Progress Notes (Signed)
Occupational Therapy Treatment Patient Details Name: Joseph Khan MRN: [id number] DOB: 10/30/1945 Today's Date: 01/19/2016    History of present illness s/ L TKA 01/17/16.    OT comments  All OT education completed and pt questions answered. No further OT needs. Will sign off.  Follow Up Recommendations  No OT follow up    Equipment Recommendations  None recommended by OT    Recommendations for Other Services      Precautions / Restrictions Precautions Precautions: Fall;Knee Restrictions Weight Bearing Restrictions: No Other Position/Activity Restrictions: WBAT       Mobility Bed Mobility Overal bed mobility: Needs Assistance Bed Mobility: Supine to Sit;Sit to Supine     Supine to sit: Min assist Sit to supine: Min assist   General bed mobility comments: small amount of assist for L LE  Transfers Overall transfer level: Needs assistance Equipment used: Rolling walker (2 wheeled) Transfers: Sit to/from Stand Sit to Stand: Min guard;Supervision              Balance                                   ADL Overall ADL's : Needs assistance/impaired                 Upper Body Dressing : Set up;Sitting   Lower Body Dressing: Minimal assistance;Sit to/from stand               Functional mobility during ADLs: Supervision/safety;Rolling walker General ADL Comments: Patient practiced getting dressed sit to stand from EOB. Having a lot of pain today. Nurse made aware. Ice applied and pt returned to supine in bed at end of session. Wife plans to assist pt with ADLs as needed at home.      Vision                     Perception     Praxis      Cognition   Behavior During Therapy: WFL for tasks assessed/performed Overall Cognitive Status: Within Functional Limits for tasks assessed                       Extremity/Trunk Assessment               Exercises     Shoulder Instructions       General Comments       Pertinent Vitals/ Pain       Pain Assessment: 0-10 Pain Score: 8  Pain Location: L knee Pain Descriptors / Indicators: Aching;Sore;Spasm Pain Intervention(s): Limited activity within patient's tolerance;Monitored during session;Patient requesting pain meds-RN notified;Premedicated before session;Ice applied  Home Living                                          Prior Functioning/Environment              Frequency           Progress Toward Goals  OT Goals(current goals can now be found in the care plan section)  Progress towards OT goals: Goals met/education completed, patient discharged from OT  Acute Rehab OT Goals Patient Stated Goal: home today  Plan All goals met and education completed, patient discharged from OT services    Co-evaluation  End of Session Equipment Utilized During Treatment: Rolling walker   Activity Tolerance Patient tolerated treatment well   Patient Left in bed;with call bell/phone within reach;with family/visitor present   Nurse Communication Patient requests pain meds        Time: 1779-3903 OT Time Calculation (min): 20 min  Charges: OT General Charges $OT Visit: 1 Procedure OT Treatments $Self Care/Home Management : 8-22 mins  Melyna Huron A 01/19/2016, 10:39 AM

## 2016-01-19 NOTE — Progress Notes (Signed)
Physical Therapy Treatment Patient Details Name: Orbra Arnott MRN: [id number] DOB: 09-11-1945 Today's Date: 01/19/2016    History of Present Illness s/ L TKA 01/17/16.     PT Comments    Pt reports increased pain in L knee and back that started earlier this morning. Increased time and effort to mobilize on today. Pt only able to tolerate ambulation distance of ~30 feet. Made RN aware of pain issues. Will plan to have a 2nd session after next round of pain meds.    Follow Up Recommendations  Home health PT     Equipment Recommendations  Rolling walker with 5" wheels    Recommendations for Other Services       Precautions / Restrictions Precautions Precautions: Fall;Knee Restrictions Weight Bearing Restrictions: No Other Position/Activity Restrictions: WBAT    Mobility  Bed Mobility Overal bed mobility: Needs Assistance Bed Mobility: Supine to Sit;Sit to Supine     Supine to sit: Min assist Sit to supine: Min assist   General bed mobility comments: assist for L LE and trunk. Increased time.   Transfers Overall transfer level: Needs assistance Equipment used: Rolling walker (2 wheeled) Transfers: Sit to/from Stand Sit to Stand: Min assist         General transfer comment: Assist to stabilize. VCs hand/LE placement  Ambulation/Gait Ambulation/Gait assistance: Min assist Ambulation Distance (Feet): 30 Feet Assistive device: Rolling walker (2 wheeled) Gait Pattern/deviations: Step-to pattern;Trunk flexed;Antalgic     General Gait Details: Intermittent assist to stabilize. Very slow and effortful gait pattern on today. Distance limited by pain   Stairs            Wheelchair Mobility    Modified Rankin (Stroke Patients Only)       Balance                                    Cognition Arousal/Alertness: Awake/alert Behavior During Therapy: WFL for tasks assessed/performed Overall Cognitive Status: Within Functional Limits for  tasks assessed                      Exercises Total Joint Exercises Ankle Circles/Pumps: AROM;Both;10 reps;Supine Quad Sets: AROM;Left;10 reps;Supine Hip ABduction/ADduction: AAROM;Left;10 reps;Supine Straight Leg Raises: AAROM;Left;Supine (7 reps) Knee Flexion: AAROM;Left;Seated Goniometric ROM: ~10-45 degrees (limited by pain)    General Comments        Pertinent Vitals/Pain Pain Assessment: Faces Pain Score: 8  Pain Location: L knee, lower back Pain Descriptors / Indicators: Spasm;Aching;Sore;Sharp Pain Intervention(s): Limited activity within patient's tolerance;Ice applied;Heat applied;Repositioned (head applied to back, ice to knee)    Home Living                      Prior Function            PT Goals (current goals can now be found in the care plan section) Acute Rehab PT Goals Patient Stated Goal: home today Progress towards PT goals: Progressing toward goals    Frequency    7X/week      PT Plan Current plan remains appropriate    Co-evaluation             End of Session   Activity Tolerance: Patient limited by pain Patient left: in bed;with call bell/phone within reach;with family/visitor present     Time: 1051-1120 PT Time Calculation (min) (ACUTE ONLY): 29 min  Charges:  $Gait Training: 8-22 mins $  Therapeutic Exercise: 8-22 mins                    G Codes:      [name], MPT Pager: [phone]

## 2016-01-19 NOTE — Progress Notes (Signed)
   Subjective: 2 Days Post-Op Procedure(s) (LRB): LEFT TOTAL KNEE ARTHROPLASTY (Left) Patient reports pain as mild.   Patient seen in rounds for Dr. Alvan Dame. Patient is well, and has had no acute complaints or problems. Reports that his pain is under better control. Voiding well. No BM bu positive flatus.   Objective: Vital signs in last 24 hours: Temp:  [98.2 F (36.8 C)-99 F (37.2 C)] 99 F (37.2 C) (09/28 0531) Pulse Rate:  [48-62] 48 (09/28 0531) Resp:  [16] 16 (09/28 0531) BP: (123-156)/(49-69) 136/54 (09/28 0531) SpO2:  [95 %-100 %] 97 % (09/28 0531)  Intake/Output from previous day:  Intake/Output Summary (Last 24 hours) at 01/19/16 0752 Last data filed at 01/19/16 0532  Gross per 24 hour  Intake              600 ml  Output             1100 ml  Net             -500 ml     Labs:  Recent Labs  01/18/16 0454  HGB 11.8*    Recent Labs  01/18/16 0454  WBC 10.7*  RBC 3.66*  HCT 33.7*  PLT 175    Recent Labs  01/18/16 0454  NA 139  K 4.1  CL 107  CO2 24  BUN 27*  CREATININE 1.37*  GLUCOSE 152*  CALCIUM 8.4*    EXAM General - Patient is Alert and Oriented Extremity - Neurologically intact Intact pulses distally Dorsiflexion/Plantar flexion intact No cellulitis present Compartment soft Dressing/Incision - clean, dry, no drainage Motor Function - intact, moving foot and toes well on exam.   Past Medical History:  Diagnosis Date  . Arthritis    Osteoarthrits /pain left knee  . Chronically dry eyes    bilateral  . GERD (gastroesophageal reflux disease)    omeprazole controls  . Hypertension   . Post traumatic stress disorder (PTSD)    "night mares, shadow boxing"  . Sleep apnea    Cpap settings automatic    Assessment/Plan: 2 Days Post-Op Procedure(s) (LRB): LEFT TOTAL KNEE ARTHROPLASTY (Left) Active Problems:   S/P total knee replacement using cement  Estimated body mass index is 39.87 kg/m as calculated from the following:  Height as of this encounter: 5\' 6"  (1.676 m).   Weight as of this encounter: 112 kg (247 lb). Advance diet Up with therapy Discharge home   DVT Prophylaxis - Aspirin Weight-Bearing as tolerated   Has responded well to change in pain management. Will continue with therapy today. Discharge home today. Instructions and Rx given.   Ardeen Jourdain, PA-C Orthopaedic Surgery 01/19/2016, 7:52 AM

## 2016-01-19 NOTE — Progress Notes (Signed)
Physical Therapy Treatment Patient Details Name: Joseph Khan MRN: [id number] DOB: 09/18/1945 Today's Date: 01/19/2016    History of Present Illness s/ L TKA 01/17/16.     PT Comments    Pt still experiencing back spasms and severe L knee pain with activity. Only able to walk ~30 feet this pm. Pt was trembling throughout entire gait distance. Did not attempt stair negotiation-pt currently unable to tolerate or complete task. Made RN aware-she stated she would notify MD. [name] if pt is able to d/c home on today in current condition.    Follow Up Recommendations  Home health PT     Equipment Recommendations  Rolling walker with 5" wheels    Recommendations for Other Services       Precautions / Restrictions Precautions Precautions: Fall;Knee Restrictions Weight Bearing Restrictions: No Other Position/Activity Restrictions: WBAT    Mobility  Bed Mobility Overal bed mobility: Needs Assistance Bed Mobility: Supine to Sit;Sit to Supine     Supine to sit: Min assist Sit to supine: Min assist   General bed mobility comments: assist for L LE. Increased time.   Transfers Overall transfer level: Needs assistance Equipment used: Rolling walker (2 wheeled) Transfers: Sit to/from Stand Sit to Stand: Min assist         General transfer comment: Assist to stabilize. VCs hand/LE placement  Ambulation/Gait Ambulation/Gait assistance: Min assist Ambulation Distance (Feet): 30 Feet Assistive device: Rolling walker (2 wheeled) Gait Pattern/deviations: Step-to pattern;Trunk flexed;Antalgic     General Gait Details: Intermittent assist to stabilize. Very slow and effortful gait pattern on today. Distance limited by pain. Pt barely tolerating WBing on L LE.   Stairs Stairs:  (Did not attempt-pt unable to tolerate)          Wheelchair Mobility    Modified Rankin (Stroke Patients Only)       Balance                                    Cognition  Arousal/Alertness: Awake/alert Behavior During Therapy: WFL for tasks assessed/performed Overall Cognitive Status: Within Functional Limits for tasks assessed                      Exercises   General Comments        Pertinent Vitals/Pain Pain Assessment: 0-10 Pain Score: 9  Pain Location: L knee, lower back Pain Descriptors / Indicators: Spasm;Sore;Aching;Sharp Pain Intervention(s): Limited activity within patient's tolerance;Repositioned;Ice applied    Home Living                      Prior Function            PT Goals (current goals can now be found in the care plan section) Progress towards PT goals: Not progressing toward goals - comment (limited by pain)    Frequency    7X/week      PT Plan Current plan remains appropriate    Co-evaluation             End of Session   Activity Tolerance: Patient limited by pain Patient left: in bed;with call bell/phone within reach;with family/visitor present     Time: 1442-1500 PT Time Calculation (min) (ACUTE ONLY): 18 min  Charges:  $Gait Training: 8-22 mins $Therapeutic Exercise: 8-22 mins  G Codes:      [name], MPT Pager: [phone]

## 2016-01-20 NOTE — Progress Notes (Signed)
RN reviewed discharge instructions with patient and family. All questions answered.   Paperwork and prescriptions given.   NT rolled patient down in wheelchair with all belongings to family car.    

## 2016-01-20 NOTE — Progress Notes (Signed)
Patient ID: Joseph Khan, male   DOB: 12/07/1945, 70 y.o.   MRN: [id number] Subjective: 3 Days Post-Op Procedure(s) (LRB): LEFT TOTAL KNEE ARTHROPLASTY (Left)    Patient reports pain as moderate.  Feels that he his doing better from yesterday.  A lot of back spasms when OOB with OT and PT  Objective:   VITALS:   Vitals:   01/19/16 2110 01/20/16 0446  BP: (!) 133/49 140/69  Pulse: 61 66  Resp: 16 17  Temp: 99.1 F (37.3 C) 99.5 F (37.5 C)    Neurovascular intact Incision: dressing C/D/I  LABS  Recent Labs  01/18/16 0454  HGB 11.8*  HCT 33.7*  WBC 10.7*  PLT 175     Recent Labs  01/18/16 0454  NA 139  K 4.1  BUN 27*  CREATININE 1.37*  GLUCOSE 152*    No results for input(s): LABPT, INR in the last 72 hours.   Assessment/Plan: 3 Days Post-Op Procedure(s) (LRB): LEFT TOTAL KNEE ARTHROPLASTY (Left)   Up with therapy Discharge home with home health   Reviewed goals Home today RTC in 2 weeks Rx on chart

## 2016-01-20 NOTE — Progress Notes (Signed)
Physical Therapy Treatment Patient Details Name: Joseph Khan MRN: [id number] DOB: [birth year] Today's Date: 01/20/2016    History of Present Illness s/ L TKA 01/17/16.     PT Comments    Improved activity tolerance and pain control on today. Reviewed/practiced exercises, gait training, and stair training. All education completed. Ready to d/c from PT standpoint.   Follow Up Recommendations  Home health PT     Equipment Recommendations  Rolling walker with 5" wheels    Recommendations for Other Services       Precautions / Restrictions Precautions Precautions: Fall;Knee Restrictions Weight Bearing Restrictions: No Other Position/Activity Restrictions: WBAT    Mobility  Bed Mobility Overal bed mobility: Needs Assistance Bed Mobility: Supine to Sit;Sit to Supine     Supine to sit: Min assist Sit to supine: Min assist   General bed mobility comments: assist for L LE. Increased time.   Transfers Overall transfer level: Needs assistance Equipment used: Rolling walker (2 wheeled) Transfers: Sit to/from Stand Sit to Stand: Min guard         General transfer comment: Assist to stabilize. VCs hand/LE placement  Ambulation/Gait Ambulation/Gait assistance: Min guard Ambulation Distance (Feet): 70 Feet Assistive device: Rolling walker (2 wheeled) Gait Pattern/deviations: Step-to pattern     General Gait Details: close guard for safety. slow gait speed.    Stairs Stairs: Yes Stairs assistance: Min guard Stair Management: Step to pattern;Backwards;With walker Number of Stairs: 1 General stair comments: VCs safety, technique, sequence. close guard for safety.   Wheelchair Mobility    Modified Rankin (Stroke Patients Only)       Balance                                    Cognition Arousal/Alertness: Awake/alert Behavior During Therapy: WFL for tasks assessed/performed Overall Cognitive Status: Within Functional Limits for tasks assessed                      Exercises Total Joint Exercises Ankle Circles/Pumps: AROM;Both;10 reps;Supine Quad Sets: AROM;Left;10 reps;Supine Hip ABduction/ADduction: AAROM;Left;10 reps;Supine Straight Leg Raises: AAROM;Left;Supine Knee Flexion: AAROM;Left;10 reps;Seated Goniometric ROM: ~10-70 degrees    General Comments        Pertinent Vitals/Pain Pain Assessment: 0-10 Pain Score: 7  Pain Location: L knee, lower back Pain Descriptors / Indicators: Spasm;Sore;Aching Pain Intervention(s): Monitored during session;Repositioned;Ice applied    Home Living                      Prior Function            PT Goals (current goals can now be found in the care plan section) Progress towards PT goals: Progressing toward goals    Frequency    7X/week      PT Plan Current plan remains appropriate    Co-evaluation             End of Session   Activity Tolerance: Patient tolerated treatment well Patient left: in bed;with call bell/phone within reach;with family/visitor present     Time: [id number] PT Time Calculation (min) (ACUTE ONLY): 28 min  Charges:  $Gait Training: 8-22 mins $Therapeutic Exercise: 8-22 mins                    G Codes:      [name], MPT Pager: [phone]

## 2016-01-26 NOTE — Discharge Summary (Signed)
Physician Discharge Summary  Patient ID: Joseph Khan MRN: [id number] DOB/AGE: 11/04/1945 70 y.o.  Admit date: 01/17/2016 Discharge date: 01/20/2016   Procedures:  Procedure(s) (LRB): LEFT TOTAL KNEE ARTHROPLASTY (Left)  Attending Physician:  Dr. Paralee Cancel   Admission Diagnoses:   Left knee primary OA / pain  Discharge Diagnoses:  Active Problems:   S/P total knee replacement using cement  Past Medical History:  Diagnosis Date  . Arthritis    Osteoarthrits /pain left knee  . Chronically dry eyes    bilateral  . GERD (gastroesophageal reflux disease)    omeprazole controls  . Hypertension   . Post traumatic stress disorder (PTSD)    "night mares, shadow boxing"  . Sleep apnea    Cpap settings automatic    HPI:    Joseph Khan, 70 y.o. male, has a history of pain and functional disability in the left knee due to arthritis and has failed non-surgical conservative treatments for greater than 12 weeks to include NSAID's and/or analgesics, corticosteriod injections, viscosupplementation injections and activity modification.  Onset of symptoms was gradual, starting 5-6 years ago with gradually worsening course since that time. The patient noted no past surgery on the left knee(s).  Patient currently rates pain in the left knee(s) at 8 out of 10 with activity. Patient has worsening of pain with activity and weight bearing, pain that interferes with activities of daily living, pain with passive range of motion, crepitus and joint swelling.  Patient has evidence of periarticular osteophytes and joint space narrowing by imaging studies. There is no active infection.  Risks, benefits and expectations were discussed with the patient.  Risks including but not limited to the risk of anesthesia, blood clots, nerve damage, blood vessel damage, failure of the prosthesis, infection and up to and including death.  Patient understand the risks, benefits and expectations and wishes to proceed with  surgery.   PCP: Donnie Coffin, MD   Discharged Condition: good  Hospital Course:  Patient underwent the above stated procedure on 01/17/2016. Patient tolerated the procedure well and brought to the recovery room in good condition and subsequently to the floor.  POD #1 BP: 125/51 ; Pulse: 50 ; Temp: 97.8 F (36.6 C) ; Resp: 18 Patient reports pain as moderate.  Restless night, didn't feel meds provided enough coverage time wise. Neurovascular intact and incision: dressing C/D/I.  LABS  Basename    HGB     11.8  HCT     33.7   POD #2  BP: 136/54 ; Pulse: 48 ; Temp: 99 F (37.2 C) ; Resp: 16 Patient reports pain as mild.  Patient is well, and has had no acute complaints or problems. Reports that his pain is under better control. Voiding well. No BM but positive flatus. Patient is Alert and Oriented. Neurologically intact, intact pulses distally, dorsiflexion/plantar flexion intact, no cellulitis present and compartment soft.  Dressing/Incision - clean, dry, no drainage. Motor Function - intact, moving foot and toes well on exam.   LABS  Basename    HGB     11.8  HCT     33.7   POD #3  BP: 140/69 ; Pulse: 66 ; Temp: 99.5 F (37.5 C) ; Resp: 17 Patient reports pain as moderate.  Feels that he his doing better from yesterday.  A lot of back spasms when OOB with OT and PT. Neurovascular intact and incision: dressing C/D/I.  LABS   No new labs    Discharge Exam: General appearance:  alert, cooperative and no distress Extremities: Homans sign is negative, no sign of DVT, no edema, redness or tenderness in the calves or thighs and no ulcers, gangrene or trophic changes  Disposition: Home with follow up in 2 weeks   Follow-up Information    Joseph Pole, MD. Schedule an appointment as soon as possible for a visit in 2 week(s).   Specialty:  Orthopedic Surgery Contact information: [address] [place] [address] [phone]        [facility] .   Why:  rolling walker Contact information: [place] [address] [phone]        [facility] .   Why:  now known as [facility].  This agency will call you to schedule your at home physical therapy. Contact information: [place] [address] [phone]           Discharge Instructions    Call MD / Call 911    Complete by:  As directed    If you experience chest pain or shortness of breath, CALL 911 and be transported to the hospital emergency room.  If you develope a fever above 101 F, pus (white drainage) or increased drainage or redness at the wound, or calf pain, call your surgeon's office.   Constipation Prevention    Complete by:  As directed    Drink plenty of fluids.  Prune juice may be helpful.  You may use a stool softener, such as Colace (over the counter) 100 mg twice a day.  Use MiraLax (over the counter) for constipation as needed.   Diet - low sodium heart healthy    Complete by:  As directed    Discharge instructions    Complete by:  As directed    INSTRUCTIONS AFTER JOINT REPLACEMENT   Remove items at home which could result in a fall. This includes throw rugs or furniture in walking pathways ICE to the affected joint every three hours while awake for 30 minutes at a time, for at least the first 3-5 days, and then as needed for pain and swelling.  Continue to use ice for pain and swelling. You may notice swelling that will progress down to the foot and ankle.  This is normal after surgery.  Elevate your leg when you are not up walking on it.   Continue to use the breathing machine you got in the hospital (incentive spirometer) which will help keep your temperature down.  It is common for your temperature to cycle up and down following surgery, especially at night when you are not up moving around and exerting yourself.  The breathing machine keeps your lungs expanded and your temperature  down.   DIET:  As you were doing prior to hospitalization, we recommend a well-balanced diet.  DRESSING / WOUND CARE / SHOWERING  Keep the surgical dressing until follow up.  The dressing is water proof, so you can shower without any extra covering.  IF THE DRESSING FALLS OFF or the wound gets wet inside, change the dressing with sterile gauze.  Please use good hand washing techniques before changing the dressing.  Do not use any lotions or creams on the incision until instructed by your surgeon.    ACTIVITY  Increase activity slowly as tolerated, but follow the weight bearing instructions below.   No driving for 6 weeks or until further direction given by your physician.  You  cannot drive while taking narcotics.  No lifting or carrying greater than 10 lbs. until further directed by your surgeon. Avoid periods of inactivity such as sitting longer than an hour when not asleep. This helps prevent blood clots.  You may return to work once you are authorized by your doctor.     WEIGHT BEARING   Weight bearing as tolerated with assist device (walker, cane, etc) as directed, use it as long as suggested by your surgeon or therapist, typically at least 4-6 weeks.   EXERCISES  Results after joint replacement surgery are often greatly improved when you follow the exercise, range of motion and muscle strengthening exercises prescribed by your doctor. Safety measures are also important to protect the joint from further injury. Any time any of these exercises cause you to have increased pain or swelling, decrease what you are doing until you are comfortable again and then slowly increase them. If you have problems or questions, call your caregiver or physical therapist for advice.   Rehabilitation is important following a joint replacement. After just a few days of immobilization, the muscles of the leg can become weakened and shrink (atrophy).  These exercises are designed to build up the tone and  strength of the thigh and leg muscles and to improve motion. Often times heat used for twenty to thirty minutes before working out will loosen up your tissues and help with improving the range of motion but do not use heat for the first two weeks following surgery (sometimes heat can increase post-operative swelling).   These exercises can be done on a training (exercise) mat, on the floor, on a table or on a bed. Use whatever works the best and is most comfortable for you.    Use music or television while you are exercising so that the exercises are a pleasant break in your day. This will make your life better with the exercises acting as a break in your routine that you can look forward to.   Perform all exercises about fifteen times, three times per day or as directed.  You should exercise both the operative leg and the other leg as well.  Exercises include:   Quad Sets - Tighten up the muscle on the front of the thigh (Quad) and hold for 5-10 seconds.   Straight Leg Raises - With your knee straight (if you were given a brace, keep it on), lift the leg to 60 degrees, hold for 3 seconds, and slowly lower the leg.  Perform this exercise against resistance later as your leg gets stronger.  Leg Slides: Lying on your back, slowly slide your foot toward your buttocks, bending your knee up off the floor (only go as far as is comfortable). Then slowly slide your foot back down until your leg is flat on the floor again.  Angel Wings: Lying on your back spread your legs to the side as far apart as you can without causing discomfort.  Hamstring Strength:  Lying on your back, push your heel against the floor with your leg straight by tightening up the muscles of your buttocks.  Repeat, but this time bend your knee to a comfortable angle, and push your heel against the floor.  You may put a pillow under the heel to make it more comfortable if necessary.   A rehabilitation program following joint replacement  surgery can speed recovery and prevent re-injury in the future due to weakened muscles. Contact your doctor or a physical therapist for more  information on knee rehabilitation.    CONSTIPATION  Constipation is defined medically as fewer than three stools per week and severe constipation as less than one stool per week.  Even if you have a regular bowel pattern at home, your normal regimen is likely to be disrupted due to multiple reasons following surgery.  Combination of anesthesia, postoperative narcotics, change in appetite and fluid intake all can affect your bowels.   YOU MUST use at least one of the following options; they are listed in order of increasing strength to get the job done.  They are all available over the counter, and you may need to use some, POSSIBLY even all of these options:    Drink plenty of fluids (prune juice may be helpful) and high fiber foods Colace 100 mg by mouth twice a day  Senokot for constipation as directed and as needed Dulcolax (bisacodyl), take with full glass of water  Miralax (polyethylene glycol) once or twice a day as needed.  If you have tried all these things and are unable to have a bowel movement in the first 3-4 days after surgery call either your surgeon or your primary doctor.    If you experience loose stools or diarrhea, hold the medications until you stool forms back up.  If your symptoms do not get better within 1 week or if they get worse, check with your doctor.  If you experience "the worst abdominal pain ever" or develop nausea or vomiting, please contact the office immediately for further recommendations for treatment.   ITCHING:  If you experience itching with your medications, try taking only a single pain pill, or even half a pain pill at a time.  You can also use Benadryl over the counter for itching or also to help with sleep.   TED HOSE STOCKINGS:  Use stockings on both legs until for at least 2 weeks or as directed by physician  office. They may be removed at night for sleeping.  MEDICATIONS:  See your medication summary on the "After Visit Summary" that nursing will review with you.  You may have some home medications which will be placed on hold until you complete the course of blood thinner medication.  It is important for you to complete the blood thinner medication as prescribed.  PRECAUTIONS:  If you experience chest pain or shortness of breath - call 911 immediately for transfer to the hospital emergency department.   If you develop a fever greater that 101 F, purulent drainage from wound, increased redness or drainage from wound, foul odor from the wound/dressing, or calf pain - CONTACT YOUR SURGEON.                                                   FOLLOW-UP APPOINTMENTS:  If you do not already have a post-op appointment, please call the office for an appointment to be seen by your surgeon.  Guidelines for how soon to be seen are listed in your "After Visit Summary", but are typically between 1-4 weeks after surgery.   MAKE SURE YOU:  Understand these instructions.  Get help right away if you are not doing well or get worse.    Thank you for letting us be a part of your medical care team.  It is a privilege we respect greatly.  We hope these instructions  will help you stay on track for a fast and full recovery!   Increase activity slowly as tolerated    Complete by:  As directed         Medication List    STOP taking these medications   aspirin EC 81 MG tablet Replaced by:  aspirin 81 MG chewable tablet     TAKE these medications   aspirin 81 MG chewable tablet Chew 1 tablet (81 mg total) by mouth 2 (two) times daily. Replaces:  aspirin EC 81 MG tablet   benazepril-hydrochlorthiazide 10-12.5 MG tablet Commonly known as:  LOTENSIN HCT Take 1 tablet by mouth daily.   chlorpheniramine 4 MG tablet Commonly known as:  CHLOR-TRIMETON Take 4 mg by mouth every 6 (six) hours as needed for  allergies.   DELSYM 30 MG/5ML liquid Generic drug:  dextromethorphan Take 30 mg by mouth as needed for cough.   DELSYM CGH/CLD NIGHTTIME CHILD 12.5-5-325 MG/10ML Liqd Generic drug:  Diphenhydramine-PE-APAP Take by mouth.   loratadine 10 MG tablet Commonly known as:  CLARITIN Take 10 mg by mouth daily.   methocarbamol 500 MG tablet Commonly known as:  ROBAXIN Take 1 tablet (500 mg total) by mouth every 6 (six) hours as needed for muscle spasms.   omeprazole 20 MG capsule Commonly known as:  PRILOSEC Take 20 mg by mouth daily.   oxyCODONE 5 MG immediate release tablet Commonly known as:  Oxy IR/ROXICODONE Take 1-3 tablets (5-15 mg total) by mouth every 4 (four) hours as needed for severe pain or breakthrough pain.   traZODone 150 MG tablet Commonly known as:  DESYREL Take 150 mg by mouth at bedtime.   Vitamin D3 3000 units Tabs Take 3,000 Units by mouth daily.        Signed: West Pugh. Nirav Sweda   PA-C  01/26/2016, 4:44 PM

## 2016-04-23 HISTORY — PX: COLONOSCOPY: SHX174

## 2016-06-19 ENCOUNTER — Encounter: Payer: Self-pay | Admitting: Gastroenterology

## 2016-11-19 ENCOUNTER — Encounter: Payer: Self-pay | Admitting: Gastroenterology

## 2016-11-20 ENCOUNTER — Encounter: Payer: Self-pay | Admitting: Gastroenterology

## 2017-01-15 ENCOUNTER — Ambulatory Visit (AMBULATORY_SURGERY_CENTER): Payer: Self-pay | Admitting: *Deleted

## 2017-01-15 VITALS — Ht 66.0 in | Wt 246.6 lb

## 2017-01-15 DIAGNOSIS — Z1211 Encounter for screening for malignant neoplasm of colon: Secondary | ICD-10-CM

## 2017-01-15 MED ORDER — NA SULFATE-K SULFATE-MG SULF 17.5-3.13-1.6 GM/177ML PO SOLN: 1.0000 [IU] | Freq: Once | ORAL | 0 refills | Status: AC

## 2017-01-15 NOTE — Progress Notes (Signed)
No egg or soy allergy known to patient  No issues with past sedation with any surgeries  or procedures, no intubation problems  No diet pills per patient No home 02 use per patient  No blood thinners per patient  Pt denies issues with constipation  No A fib or A flutter  EMMI video sent to pt's e mail  

## 2017-02-05 ENCOUNTER — Ambulatory Visit (AMBULATORY_SURGERY_CENTER): Payer: Medicare Other | Admitting: Gastroenterology

## 2017-02-05 ENCOUNTER — Encounter: Payer: Self-pay | Admitting: Gastroenterology

## 2017-02-05 VITALS — BP 106/68 | HR 60 | Temp 99.5°F | Resp 13 | Ht 66.0 in | Wt 246.0 lb

## 2017-02-05 DIAGNOSIS — Z1212 Encounter for screening for malignant neoplasm of rectum: Secondary | ICD-10-CM

## 2017-02-05 DIAGNOSIS — D128 Benign neoplasm of rectum: Secondary | ICD-10-CM

## 2017-02-05 DIAGNOSIS — Z1211 Encounter for screening for malignant neoplasm of colon: Secondary | ICD-10-CM

## 2017-02-05 DIAGNOSIS — D123 Benign neoplasm of transverse colon: Secondary | ICD-10-CM

## 2017-02-05 DIAGNOSIS — D129 Benign neoplasm of anus and anal canal: Secondary | ICD-10-CM

## 2017-02-05 MED ORDER — SODIUM CHLORIDE 0.9 % IV SOLN: 500.0000 mL | INTRAVENOUS | Status: DC

## 2017-02-05 NOTE — Progress Notes (Signed)
Called to room to assist during endoscopic procedure.  Patient ID and intended procedure confirmed with present staff. Received instructions for my participation in the procedure from the performing physician.  

## 2017-02-05 NOTE — Progress Notes (Signed)
Spontaneous respirations throughout. VSS. Resting comfortably. To PACU on room air. Report to  RN. 

## 2017-02-05 NOTE — Patient Instructions (Signed)
YOU HAD AN ENDOSCOPIC PROCEDURE TODAY AT [facility]:   Refer to the procedure report that was given to you for any specific questions about what was found during the examination.  If the procedure report does not answer your questions, please call your gastroenterologist to clarify.  If you requested that your care partner not be given the details of your procedure findings, then the procedure report has been included in a sealed envelope for you to review at your convenience later.  YOU SHOULD EXPECT: Some feelings of bloating in the abdomen. Passage of more gas than usual.  Walking can help get rid of the air that was put into your GI tract during the procedure and reduce the bloating. If you had a lower endoscopy (such as a colonoscopy or flexible sigmoidoscopy) you may notice spotting of blood in your stool or on the toilet paper. If you underwent a bowel prep for your procedure, you may not have a normal bowel movement for a few days.  Please Note:  You might notice some irritation and congestion in your nose or some drainage.  This is from the oxygen used during your procedure.  There is no need for concern and it should clear up in a day or so.  SYMPTOMS TO REPORT IMMEDIATELY:   Following lower endoscopy (colonoscopy or flexible sigmoidoscopy):  Excessive amounts of blood in the stool  Significant tenderness or worsening of abdominal pains  Swelling of the abdomen that is new, acute  Fever of 100F or higher   For urgent or emergent issues, a gastroenterologist can be reached at any hour by calling [phone].   DIET:  We do recommend a small meal at first, but then you may proceed to your regular diet.  Drink plenty of fluids but you should avoid alcoholic beverages for 24 hours.  ACTIVITY:  You should plan to take it easy for the rest of today and you should NOT DRIVE or use heavy machinery until tomorrow (because of the sedation medicines used during the test).     FOLLOW UP: Our staff will call the number listed on your records the next business day following your procedure to check on you and address any questions or concerns that you may have regarding the information given to you following your procedure. If we do not reach you, we will leave a message.  However, if you are feeling well and you are not experiencing any problems, there is no need to return our call.  We will assume that you have returned to your regular daily activities without incident.  If any biopsies were taken you will be contacted by phone or by letter within the next 1-3 weeks.  Please call us at [phone] if you have not heard about the biopsies in 3 weeks.    SIGNATURES/CONFIDENTIALITY: You and/or your care partner have signed paperwork which will be entered into your electronic medical record.  These signatures attest to the fact that that the information above on your After Visit Summary has been reviewed and is understood.  Full responsibility of the confidentiality of this discharge information lies with you and/or your care-partner.  No ibuprofen,naproxen,or other non-steroidal anti-inflammatory drugs for  2 weeks but resume remainder of medications. Information given on polyps and diverticulosis.

## 2017-02-05 NOTE — Progress Notes (Signed)
Pt's states no medical or surgical changes since previsit or office visit. 

## 2017-02-05 NOTE — Op Note (Signed)
[facility] Patient Name: Joseph Khan Procedure Date: 02/05/2017 10:05 AM MRN: [id number] Endoscopist: Remo Lipps P. Hiroto Saltzman MD, MD Age: 71 Referring MD:  Date of Birth: [birth year] Gender: Male Account #: [id number] Procedure:                Colonoscopy Indications:              Screening for colorectal malignant neoplasm Medicines:                Monitored Anesthesia Care Procedure:                Pre-Anesthesia Assessment:                           - Prior to the procedure, a History and Physical                            was performed, and patient medications and                            allergies were reviewed. The patient's tolerance of                            previous anesthesia was also reviewed. The risks                            and benefits of the procedure and the sedation                            options and risks were discussed with the patient.                            All questions were answered, and informed consent                            was obtained. Prior Anticoagulants: The patient has                            taken no previous anticoagulant or antiplatelet                            agents. ASA Grade Assessment: II - A patient with                            mild systemic disease. After reviewing the risks                            and benefits, the patient was deemed in                            satisfactory condition to undergo the procedure.                           After obtaining informed consent, the colonoscope  was passed under direct vision. Throughout the                            procedure, the patient's blood pressure, pulse, and                            oxygen saturations were monitored continuously. The                            Colonoscope was introduced through the anus and                            advanced to the the cecum, identified by                            appendiceal orifice  and ileocecal valve. The                            colonoscopy was performed without difficulty. The                            patient tolerated the procedure well. The quality                            of the bowel preparation was good. The ileocecal                            valve, appendiceal orifice, and rectum were                            photographed. Scope In: 10:26:50 AM Scope Out: 10:53:18 AM Scope Withdrawal Time: 0 hours 23 minutes 59 seconds  Total Procedure Duration: 0 hours 26 minutes 28 seconds  Findings:                 The perianal and digital rectal examinations were                            normal.                           Multiple medium-mouthed diverticula were found in                            the ascending colon and left colon.                           Three sessile polyps were found in the transverse                            colon. The polyps were 3 to 4 mm in size. These                            polyps were removed with a cold snare. Resection  and retrieval were complete.                           A 3 mm polyp was found in the rectum. The polyp was                            sessile. The polyp was removed with a cold snare.                            Resection and retrieval were complete.                           The colon was tortous. The exam was otherwise                            without abnormality on direct and retroflexion                            views. Complications:            No immediate complications. Estimated blood loss:                            Minimal. Estimated Blood Loss:     Estimated blood loss was minimal. Impression:               - Diverticulosis in the ascending colon and in the                            left colon.                           - Three 3 to 4 mm polyps in the transverse colon,                            removed with a cold snare. Resected and retrieved.                            - One 3 mm polyp in the rectum, removed with a cold                            snare. Resected and retrieved.                           - Tortous colon.                           - The examination was otherwise normal on direct                            and retroflexion views. Recommendation:           - Patient has a contact number available for                            emergencies. The signs  and symptoms of potential                            delayed complications were discussed with the                            patient. Return to normal activities tomorrow.                            Written discharge instructions were provided to the                            patient.                           - Resume previous diet.                           - Continue present medications.                           - Await pathology results.                           - Repeat colonoscopy is recommended for                            surveillance. The colonoscopy date will be                            determined after pathology results from today's                            exam become available for review.                           - No ibuprofen, naproxen, or other non-steroidal                            anti-inflammatory drugs for 2 weeks after polyp                            removal. Remo Lipps P. Fontaine Kossman MD, MD 02/05/2017 10:57:41 AM This report has been signed electronically.

## 2017-02-06 ENCOUNTER — Telehealth: Payer: Self-pay | Admitting: *Deleted

## 2017-02-06 NOTE — Telephone Encounter (Signed)
  Follow up Call-  Call back number 02/05/2017  Post procedure Call Back phone  # [phone]  Permission to leave phone message Yes  Some recent data might be hidden     Patient questions:  Do you have a fever, pain , or abdominal swelling? No. Pain Score  0 *  Have you tolerated food without any problems? Yes.    Have you been able to return to your normal activities? Yes.    Do you have any questions about your discharge instructions: Diet   No. Medications  No. Follow up visit  No.  Do you have questions or concerns about your Care? No.  Actions: * If pain score is 4 or above: No action needed, pain <4.

## 2017-02-07 ENCOUNTER — Encounter: Payer: Self-pay | Admitting: Gastroenterology

## 2018-02-23 ENCOUNTER — Ambulatory Visit (HOSPITAL_COMMUNITY)
Admission: EM | Admit: 2018-02-23 | Discharge: 2018-02-23 | Disposition: A | Discharge status: Home / Self Care | Payer: Medicare Other | Attending: Family Medicine | Admitting: Family Medicine

## 2018-02-23 ENCOUNTER — Other Ambulatory Visit: Payer: Self-pay

## 2018-02-23 ENCOUNTER — Encounter (HOSPITAL_COMMUNITY): Payer: Self-pay

## 2018-02-23 DIAGNOSIS — M542 Cervicalgia: Secondary | ICD-10-CM

## 2018-02-23 DIAGNOSIS — S46811A Strain of other muscles, fascia and tendons at shoulder and upper arm level, right arm, initial encounter: Secondary | ICD-10-CM | POA: Diagnosis not present

## 2018-02-23 DIAGNOSIS — M5489 Other dorsalgia: Secondary | ICD-10-CM | POA: Diagnosis not present

## 2018-02-23 MED ORDER — DICLOFENAC SODIUM 75 MG PO TBEC: 75.0000 mg | DELAYED_RELEASE_TABLET | Freq: Two times a day (BID) | ORAL | 0 refills | Status: DC
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
## 2018-08-08 DIAGNOSIS — G930 Cerebral cysts: Secondary | ICD-10-CM

## 2018-08-08 HISTORY — DX: Cerebral cysts: G93.0

## 2019-02-24 DIAGNOSIS — J343 Hypertrophy of nasal turbinates: Secondary | ICD-10-CM | POA: Insufficient documentation

## 2019-02-24 HISTORY — DX: Hypertrophy of nasal turbinates: J34.3

## 2019-03-23 DIAGNOSIS — J3489 Other specified disorders of nose and nasal sinuses: Secondary | ICD-10-CM | POA: Insufficient documentation

## 2019-03-23 HISTORY — DX: Other specified disorders of nose and nasal sinuses: J34.89

## 2020-06-06 ENCOUNTER — Encounter: Payer: Self-pay | Admitting: Gastroenterology

## 2020-08-01 ENCOUNTER — Other Ambulatory Visit: Payer: Self-pay

## 2020-08-01 ENCOUNTER — Ambulatory Visit (AMBULATORY_SURGERY_CENTER): Payer: Medicare Other

## 2020-08-01 VITALS — Ht 66.0 in | Wt 248.0 lb

## 2020-08-01 DIAGNOSIS — Z86010 Personal history of colonic polyps: Secondary | ICD-10-CM

## 2020-08-01 MED ORDER — SUTAB 1479-225-188 MG PO TABS: 12.0000 | ORAL_TABLET | ORAL | 0 refills | Status: DC

## 2020-08-01 NOTE — Progress Notes (Signed)
Pt verified name, DOB, address and insurance during PV today.    Pt mailed instruction packet, copy of consent for review only and instructions. [employer] coupon mailed in packet. PV completed over the phone. Pt encouraged to call with questions or issues.    No allergies to soy or egg Pt is not on blood thinners or diet pills Denies issues with sedation/intubation Denies atrial flutter/fib Denies constipation-has BM every other day    Pt is aware of Covid safety and care partner requirements.

## 2020-08-15 ENCOUNTER — Other Ambulatory Visit: Payer: Self-pay

## 2020-08-15 ENCOUNTER — Encounter: Payer: Self-pay | Admitting: Gastroenterology

## 2020-08-15 ENCOUNTER — Ambulatory Visit (AMBULATORY_SURGERY_CENTER): Payer: Medicare Other | Admitting: Gastroenterology

## 2020-08-15 VITALS — BP 152/94 | HR 61 | Temp 98.3°F | Resp 16 | Ht 66.0 in | Wt 248.0 lb

## 2020-08-15 DIAGNOSIS — Z860100 Personal history of colon polyps, unspecified: Secondary | ICD-10-CM

## 2020-08-15 DIAGNOSIS — D123 Benign neoplasm of transverse colon: Secondary | ICD-10-CM | POA: Diagnosis not present

## 2020-08-15 DIAGNOSIS — D122 Benign neoplasm of ascending colon: Secondary | ICD-10-CM

## 2020-08-15 DIAGNOSIS — D126 Benign neoplasm of colon, unspecified: Secondary | ICD-10-CM

## 2020-08-15 MED ORDER — SODIUM CHLORIDE 0.9 % IV SOLN: 500.0000 mL | Freq: Once | INTRAVENOUS | Status: DC

## 2020-08-15 NOTE — Op Note (Signed)
[facility] Patient Name: Joseph Khan Procedure Date: 08/15/2020 8:41 AM MRN: [id number] Endoscopist: Viviann Spare P. Adela Lank , MD Age: 75 Referring MD:  Date of Birth: 05-Sep-1945 Gender: Male Account #: [id number] Procedure:                Colonoscopy Indications:              High risk colon cancer surveillance: Personal                            history of colonic polyps - 4 small adenomas in                            01/2017 Medicines:                Monitored Anesthesia Care Procedure:                Pre-Anesthesia Assessment:                           - Prior to the procedure, a History and Physical                            was performed, and patient medications and                            allergies were reviewed. The patient's tolerance of                            previous anesthesia was also reviewed. The risks                            and benefits of the procedure and the sedation                            options and risks were discussed with the patient.                            All questions were answered, and informed consent                            was obtained. Prior Anticoagulants: The patient has                            taken no previous anticoagulant or antiplatelet                            agents. ASA Grade Assessment: II - A patient with                            mild systemic disease. After reviewing the risks                            and benefits, the patient was deemed in  satisfactory condition to undergo the procedure.                           After obtaining informed consent, the colonoscope                            was passed under direct vision. Throughout the                            procedure, the patient's blood pressure, pulse, and                            oxygen saturations were monitored continuously. The                            Olympus CF-HQ190 [id number]) Colonoscope was                             introduced through the anus and advanced to the the                            cecum, identified by appendiceal orifice and                            ileocecal valve. The colonoscopy was performed                            without difficulty. The patient tolerated the                            procedure well. The quality of the bowel                            preparation was good. The ileocecal valve,                            appendiceal orifice, and rectum were photographed. Scope In: 8:50:03 AM Scope Out: 9:11:55 AM Scope Withdrawal Time: 0 hours 18 minutes 20 seconds  Total Procedure Duration: 0 hours 21 minutes 52 seconds  Findings:                 The perianal and digital rectal examinations were                            normal.                           Two sessile polyps were found in the ascending                            colon. The polyps were 2 to 3 mm in size. These                            polyps were removed with a cold snare. Resection  and retrieval were complete.                           Two sessile polyps were found in the hepatic                            flexure. The polyps were 3 mm in size. These polyps                            were removed with a cold snare. Resection and                            retrieval were complete.                           A 3 mm polyp was found in the transverse colon. The                            polyp was sessile. The polyp was removed with a                            cold snare. Resection and retrieval were complete.                           Multiple medium-mouthed diverticula were found in                            the entire colon (high burden in left colon, mild                            in right / transverse).                           Internal hemorrhoids were found during retroflexion.                           The exam was otherwise without  abnormality. Complications:            No immediate complications. Estimated blood loss:                            Minimal. Estimated Blood Loss:     Estimated blood loss was minimal. Impression:               - Two 2 to 3 mm polyps in the ascending colon,                            removed with a cold snare. Resected and retrieved.                           - Two 3 mm polyps at the hepatic flexure, removed                            with a cold snare. Resected and retrieved.                           -  One 3 mm polyp in the transverse colon, removed                            with a cold snare. Resected and retrieved.                           - Diverticulosis in the entire examined colon.                           - Internal hemorrhoids.                           - The examination was otherwise normal. Recommendation:           - Patient has a contact number available for                            emergencies. The signs and symptoms of potential                            delayed complications were discussed with the                            patient. Return to normal activities tomorrow.                            Written discharge instructions were provided to the                            patient.                           - Resume previous diet.                           - Continue present medications.                           - Await pathology results. Remo Lipps P. Chellie Vanlue, MD 08/15/2020 9:18:57 AM This report has been signed electronically.

## 2020-08-15 NOTE — Progress Notes (Signed)
Report given to PACU, vss 

## 2020-08-15 NOTE — Progress Notes (Signed)
VS-CW  Pt's states no medical or surgical changes since previsit or office visit.  

## 2020-08-15 NOTE — Patient Instructions (Signed)
 Handouts on polyps,diverticulosis ,& hemorrhoids given to you today  Await pathology results on polyps removed    YOU HAD AN ENDOSCOPIC PROCEDURE TODAY AT THE [facility]:   Refer to the procedure report that was given to you for any specific questions about what was found during the examination.  If the procedure report does not answer your questions, please call your gastroenterologist to clarify.  If you requested that your care partner not be given the details of your procedure findings, then the procedure report has been included in a sealed envelope for you to review at your convenience later.  YOU SHOULD EXPECT: Some feelings of bloating in the abdomen. Passage of more gas than usual.  Walking can help get rid of the air that was put into your GI tract during the procedure and reduce the bloating. If you had a lower endoscopy (such as a colonoscopy or flexible sigmoidoscopy) you may notice spotting of blood in your stool or on the toilet paper. If you underwent a bowel prep for your procedure, you may not have a normal bowel movement for a few days.  Please Note:  You might notice some irritation and congestion in your nose or some drainage.  This is from the oxygen used during your procedure.  There is no need for concern and it should clear up in a day or so.  SYMPTOMS TO REPORT IMMEDIATELY:   Following lower endoscopy (colonoscopy or flexible sigmoidoscopy):  Excessive amounts of blood in the stool  Significant tenderness or worsening of abdominal pains  Swelling of the abdomen that is new, acute  Fever of 100F or higher    For urgent or emergent issues, a gastroenterologist can be reached at any hour by calling [phone]. Do not use MyChart messaging for urgent concerns.    DIET:  We do recommend a small meal at first, but then you may proceed to your regular diet.  Drink plenty of fluids but you should avoid alcoholic beverages for 24 hours.  ACTIVITY:   You should plan to take it easy for the rest of today and you should NOT DRIVE or use heavy machinery until tomorrow (because of the sedation medicines used during the test).    FOLLOW UP: Our staff will call the number listed on your records 48-72 hours following your procedure to check on you and address any questions or concerns that you may have regarding the information given to you following your procedure. If we do not reach you, we will leave a message.  We will attempt to reach you two times.  During this call, we will ask if you have developed any symptoms of COVID 19. If you develop any symptoms (ie: fever, flu-like symptoms, shortness of breath, cough etc.) before then, please call [phone].  If you test positive for Covid 19 in the 2 weeks post procedure, please call and report this information to us.    If any biopsies were taken you will be contacted by phone or by letter within the next 1-3 weeks.  Please call us at [phone] if you have not heard about the biopsies in 3 weeks.    SIGNATURES/CONFIDENTIALITY: You and/or your care partner have signed paperwork which will be entered into your electronic medical record.  These signatures attest to the fact that that the information above on your After Visit Summary has been reviewed and is understood.  Full responsibility of the confidentiality of this discharge information lies with you   and/or your care-partner. 

## 2020-08-15 NOTE — Progress Notes (Signed)
Called to room to assist during endoscopic procedure.  Patient ID and intended procedure confirmed with present staff. Received instructions for my participation in the procedure from the performing physician.  

## 2020-08-17 ENCOUNTER — Telehealth: Payer: Self-pay | Admitting: *Deleted

## 2020-08-17 NOTE — Telephone Encounter (Signed)
Left voicemail for patient to call with questions or concerns. 

## 2020-08-17 NOTE — Telephone Encounter (Signed)
  Follow up Call-  Call back number 08/15/2020  Post procedure Call Back phone  # [phone]  Permission to leave phone message Yes  Some recent data might be hidden     First attempt for follow up phone call. No answer at number given.  Left message on voicemail.

## 2020-11-09 ENCOUNTER — Encounter: Payer: Self-pay | Admitting: Emergency Medicine

## 2020-11-09 ENCOUNTER — Ambulatory Visit
Admission: EM | Admit: 2020-11-09 | Discharge: 2020-11-09 | Disposition: A | Discharge status: Home / Self Care | Payer: Medicare Other | Attending: Emergency Medicine | Admitting: Emergency Medicine

## 2020-11-09 ENCOUNTER — Other Ambulatory Visit: Payer: Self-pay

## 2020-11-09 DIAGNOSIS — M5441 Lumbago with sciatica, right side: Secondary | ICD-10-CM | POA: Diagnosis not present

## 2020-11-09 MED ORDER — PREDNISONE 10 MG PO TABS: ORAL_TABLET | ORAL | 0 refills | Status: DC

## 2020-11-09 MED ORDER — DICLOFENAC SODIUM 75 MG PO TBEC: 75.0000 mg | DELAYED_RELEASE_TABLET | Freq: Two times a day (BID) | ORAL | 0 refills | Status: DC

## 2020-11-09 MED ORDER — TIZANIDINE HCL 2 MG PO TABS: 2.0000 mg | ORAL_TABLET | Freq: Four times a day (QID) | ORAL | 0 refills | Status: DC | PRN

## 2020-11-09 NOTE — Discharge Instructions (Addendum)
Begin prednisone taper x6 days-begin with 6 tablets on day 1, decrease by 1 tablet each day until complete-6, 5, 4, 3, 2, 1-take with food and earlier in the day if possible After completing prednisone May use diclofenac twice daily with food as needed for further pain May use prednisone/diclofenac with tizanidine at home/bedtime, may cause drowsiness, this is muscle relaxer Alternate ice and heat Gentle movement/stretching, avoid bedrest Follow-up if not improving or worsening

## 2020-11-09 NOTE — ED Provider Notes (Signed)
[facility]    CSN: [id number] Arrival date & time: 11/09/20  0809      History   Chief Complaint Chief Complaint  Patient presents with   Hip Pain    Thigh pain (right )    HPI Joseph Khan is a 75 y.o. male history of GERD, hypertension, sleep apnea presenting today for evaluation of right hip and thigh pain.  Reports over the past month has had pain in his lower back, hip and radiating into right leg behind his thigh.  This morning felt symptoms radiating into calf as well.  Denies specific injury or trauma.  Has been having to ambulate with a cane which she typically does not use.  Denies using any medicines for symptoms.  Denies numbness or tingling.  Denies saddle anesthesia.  Denies change in urination with onset of symptoms.  Denies hematuria.  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 Authorizing Provider  diclofenac (VOLTAREN) 75 MG EC tablet Take 1 tablet (75 mg total) by mouth 2 (two) times daily. Use as needed after completion of prednisone course 11/09/20  Yes Caela Huot C, PA-C  predniSONE (DELTASONE) 10 MG tablet Begin with 6 tabs  on day 1, 5 tab on day 2, 4 tab on day 3, 3 tab on day 4, 2 tab on day 5, 1 tab on day 6-take with food 11/09/20  Yes Claus Silvestro C, PA-C  tiZANidine (ZANAFLEX) 2 MG tablet Take 1-2 tablets (2-4 mg total) by mouth every 6 (six) hours as needed for muscle spasms. 11/09/20  Yes Brylinn Teaney C, PA-C  amLODipine (NORVASC) 5 MG tablet  10/27/19   [provider]  aspirin 81 MG chewable tablet Chew 1 tablet (81 mg total) by mouth 2 (two) times daily. Patient not taking: No sig reported 01/17/16   [place], [employer], PA-C  benazepril (LOTENSIN) 20 MG tablet Take 20 mg by mouth daily. 06/23/20   [provider]  cetirizine (ZYRTEC) 10 MG tablet Take by mouth.    [provider]  chlorpheniramine (CHLOR-TRIMETON) 4 MG tablet Take 4 mg by mouth every 6 (six) hours as needed for allergies.    [provider]  Cholecalciferol (VITAMIN D3) 3000 units TABS Take 3,000 Units by mouth daily.    [provider]  cyclobenzaprine (FLEXERIL) 10 MG tablet  07/25/20 07/26/21  [provider]  FLUoxetine (PROZAC) 20 MG capsule Take 20 mg by mouth daily. Patient not taking: Reported on 08/15/2020 08/06/20   [provider]  fluticasone Asencion Islam)  50 MCG/ACT nasal spray Place into both nostrils daily.    [provider]  omeprazole (PRILOSEC) 20 MG capsule Take 20 mg by mouth daily.    [provider]  Propylene Glycol (SYSTANE BALANCE) 0.6 % SOLN INSTILL 1 DROP IN [facility] FOUR TIMES A DAY 12/29/19 12/29/20  [provider]  PSYLLIUM PO TAKE 2 TEASPOONFULS BY MOUTH AS DIRECTED BY YOUR PROVIDER (MIX IN 8 OUNCES OF WATER OR JUICE AND DRINK) FOR BOWEL. TAKE 1 TO 3 TIMES A DAY WHEN NEEDED. Patient not taking: No sig reported 07/25/20   [provider]  sodium chloride (OCEAN) 0.65 % nasal spray Place into the nose. 03/02/19   [provider]  Testosterone 20.25 MG/ACT (1.62%) GEL APPLY 2 PUMPS TO SKIN DAILY (APPLY AFTER SHOWER OR BATH  TO CLEAN, DRY, INTACT SKIN ON UPPER ARM OR SHOULDER AREAS ONLY) 08/12/20   [provider]  traZODone (DESYREL) 150 MG tablet Take 150 mg by mouth at bedtime.    [provider]    Family History Family History  Problem Relation Age of Onset   CVA Mother    Other Father    Colon cancer Neg Hx    Colon polyps Neg Hx    Esophageal cancer Neg Hx    Rectal cancer Neg Hx    Stomach cancer Neg Hx     Social History Social History   Tobacco Use   Smoking status: Former    Types: Cigars    Quit date: 01/10/1982    Years since quitting: 38.8   Smokeless tobacco: Former    Types: Chew    Quit date: 01/15/1989  Vaping Use   Vaping Use: Never used  Substance Use Topics   Alcohol use: No   Drug use: No     Allergies   Patient has no known allergies.   Review of Systems Review of Systems  Constitutional:  Negative for fatigue and fever.  Eyes:  Negative for redness, itching and visual disturbance.  Respiratory:  Negative for shortness of breath.   Cardiovascular:  Negative for chest pain and leg swelling.  Gastrointestinal:  Negative for nausea and vomiting.  Musculoskeletal:  Positive for arthralgias, back pain and myalgias.  Skin:  Negative for color change, rash and wound.  Neurological:  Negative for dizziness, syncope, weakness, light-headedness and headaches.    Physical Exam Triage Vital Signs ED Triage Vitals  Enc Vitals Group     BP      Pulse      Resp      Temp      Temp src      SpO2      Weight      Height      Head Circumference      Peak Flow      Pain Score      Pain Loc      Pain Edu?      Excl. in [place]?    No data found.  Updated Vital Signs BP 134/66 (BP Location: Right Arm)   Pulse 65   Temp 98.3 F (36.8 C) (Oral)   Resp 18   SpO2 95%   Visual Acuity Right Eye Distance:   Left Eye Distance:   Bilateral Distance:    Right Eye Near:   Left Eye Near:    Bilateral Near:     Physical Exam Vitals and nursing note  reviewed.  Constitutional:      Appearance: He is well-developed.  Comments: No acute distress  HENT:     Head: Normocephalic and atraumatic.     Nose: Nose normal.  Eyes:     Conjunctiva/sclera: Conjunctivae normal.  Cardiovascular:     Rate and Rhythm: Normal rate.  Pulmonary:     Effort: Pulmonary effort is normal. No respiratory distress.  Abdominal:     General: There is no distension.  Musculoskeletal:        General: Normal range of motion.     Cervical back: Neck supple.     Comments: Back: Nontender to palpation of lumbar spine midline, no palpable deformity or step-off, mild tenderness extending into lower lumbar area on right extending slightly into glute, hip and knee strength 5/5 and equal bilaterally although pain elicited with hip abduction and resisted hip flexion  Skin:    General: Skin is warm and dry.  Neurological:     Mental Status: He is alert and oriented to person, place, and time.     UC Treatments / Results  Labs (all labs ordered are listed, but only abnormal results are displayed) Labs Reviewed - No data to display  EKG   Radiology No results found.  Procedures Procedures (including critical care time)  Medications Ordered in UC Medications - No data to display  Initial Impression / Assessment and Plan / UC Course  I have reviewed the triage vital signs and the nursing notes.  Pertinent labs & imaging results that were available during my care of the patient were reviewed by me and considered in my medical decision making (see chart for details).     Symptoms consistent with right-sided back pain with radicular distribution on right, no neurodeficits, no urinary symptoms, symptoms x1 month with radicular distribution, will treat with prednisone course followed by NSAIDs, supplement with muscle relaxers, discussed activity modification and continued monitoring.  [facility] or [facility] medicine if  symptoms persistent.  Discussed strict return precautions. Patient verbalized understanding and is agreeable with plan.  Final Clinical Impressions(s) / UC Diagnoses   Final diagnoses:  Acute right-sided low back pain with right-sided sciatica     Discharge Instructions      Begin prednisone taper x6 days-begin with 6 tablets on day 1, decrease by 1 tablet each day until complete-6, 5, 4, 3, 2, 1-take with food and earlier in the day if possible After completing prednisone May use diclofenac twice daily with food as needed for further pain May use prednisone/diclofenac with tizanidine at home/bedtime, may cause drowsiness, this is muscle relaxer Alternate ice and heat Gentle movement/stretching, avoid bedrest Follow-up if not improving or worsening     ED Prescriptions     Medication Sig Dispense Auth. Provider   predniSONE (DELTASONE) 10 MG tablet Begin with 6 tabs on day 1, 5 tab on day 2, 4 tab on day 3, 3 tab on day 4, 2 tab on day 5, 1 tab on day 6-take with food 21 tablet Mattalynn Crandle C, PA-C   tiZANidine (ZANAFLEX) 2 MG tablet Take 1-2 tablets (2-4 mg total) by mouth every 6 (six) hours as needed for muscle spasms. 30 tablet Tyshae Stair C, PA-C   diclofenac (VOLTAREN) 75 MG EC tablet Take 1 tablet (75 mg total) by mouth 2 (two) times daily. Use as needed after completion of prednisone course 30 tablet [place], [place] C, PA-C      PDMP not reviewed this encounter.   Janith Lima, [place] 11/09/20 [phone]

## 2020-11-09 NOTE — ED Triage Notes (Signed)
Pt is present today with right hip/thigh pain.PT states that his pain started x1 month ago pt has gotten worse over the last three days ago. Pt describes the pain as a sharp/shooting pain that radiates down the hip to the thigh

## 2021-10-02 ENCOUNTER — Other Ambulatory Visit (HOSPITAL_COMMUNITY): Payer: Self-pay | Admitting: Orthopaedic Surgery

## 2021-10-02 DIAGNOSIS — M25562 Pain in left knee: Secondary | ICD-10-CM

## 2021-10-18 ENCOUNTER — Encounter (HOSPITAL_COMMUNITY)
Admission: RE | Admit: 2021-10-18 | Discharge: 2021-10-18 | Disposition: A | Discharge status: Home / Self Care | Payer: Medicare Other | Source: Ambulatory Visit | Attending: Orthopaedic Surgery | Admitting: Orthopaedic Surgery

## 2021-10-18 DIAGNOSIS — M25562 Pain in left knee: Secondary | ICD-10-CM | POA: Insufficient documentation

## 2021-10-18 MED ORDER — TECHNETIUM TC 99M MEDRONATE IV KIT
20.0000 | PACK | Freq: Once | INTRAVENOUS | Status: AC | PRN
  Administered 2021-10-18: 20 via INTRAVENOUS

## 2022-01-26 ENCOUNTER — Encounter: Payer: Self-pay | Admitting: Physician Assistant

## 2022-01-26 ENCOUNTER — Other Ambulatory Visit (INDEPENDENT_AMBULATORY_CARE_PROVIDER_SITE_OTHER): Payer: Medicare Other

## 2022-01-26 ENCOUNTER — Ambulatory Visit (INDEPENDENT_AMBULATORY_CARE_PROVIDER_SITE_OTHER): Payer: No Typology Code available for payment source | Admitting: Physician Assistant

## 2022-01-26 VITALS — BP 160/86 | HR 69 | Resp 20 | Ht 66.0 in | Wt 258.0 lb

## 2022-01-26 DIAGNOSIS — R413 Other amnesia: Secondary | ICD-10-CM

## 2022-01-26 LAB — VITAMIN B12: Vitamin B-12: 389 pg/mL (ref 211–911)

## 2022-01-26 LAB — TSH: TSH: 0.58 u[IU]/mL (ref 0.35–5.50)

## 2022-01-26 NOTE — Patient Instructions (Addendum)
swallowingIt was a pleasure to see you today at our office.   Recommendations:  MRI of the brain, the radiology office will call you to arrange you appointment Check labs today Limit muscle relaxer use Follow up in 1 month to go over the MRI results  Whom to call:  Memory  decline, memory medications: Call our office [phone]   For psychiatric meds, mood meds: Please have your primary care physician manage these medications.   Counseling regarding caregiver distress, including caregiver depression, anxiety and issues regarding community resources, adult day care programs, adult living facilities, or memory care questions:   Feel free to contact [place], Social Worker at [phone]   For assessment of decision of mental capacity and competency:  Call Dr. Anthoney Harada, geriatric psychiatrist at [phone]  For guidance in geriatric dementia issues please call [facility] [phone]  For guidance regarding [facility] and if placement were needed at the facility, contact Arnell Asal, Social Worker tel: [phone]  If you have any severe symptoms of a stroke, or other severe issues such as confusion,severe chills or fever, etc call 911 or go to the ER as you may need to be evaluated further   Feel free to visit Facebook page " [email or web address]" for tips of how to care for people with memory problems.   Feel free to go to the following database for funded clinical studies conducted around the world: http://saunders.com/   https://www.triadclinicaltrials.com/     RECOMMENDATIONS FOR ALL PATIENTS WITH MEMORY PROBLEMS: 1. Continue to exercise (Recommend 30 minutes of walking everyday, or 3 hours every week) 2. Increase social interactions - continue going to [place] and enjoy social gatherings with friends and family 3. Eat healthy, avoid fried foods and eat more fruits and vegetables 4. Maintain adequate blood pressure, blood  sugar, and blood cholesterol level. Reducing the risk of stroke and cardiovascular disease also helps promoting better memory. 5. Avoid stressful situations. Live a simple life and avoid aggravations. Organize your time and prepare for the next day in anticipation. 6. Sleep well, avoid any interruptions of sleep and avoid any distractions in the bedroom that may interfere with adequate sleep quality 7. Avoid sugar, avoid sweets as there is a strong link between excessive sugar intake, diabetes, and cognitive impairment We discussed the Mediterranean diet, which has been shown to help patients reduce the risk of progressive memory disorders and reduces cardiovascular risk. This includes eating fish, eat fruits and green leafy vegetables, nuts like almonds and hazelnuts, walnuts, and also use olive oil. Avoid fast foods and fried foods as much as possible. Avoid sweets and sugar as sugar use has been linked to worsening of memory function.  There is always a concern of gradual progression of memory problems. If this is the case, then we may need to adjust level of care according to patient needs. Support, both to the patient and caregiver, should then be put into place.      You have been referred for a neuropsychological evaluation (i.e., evaluation of memory and thinking abilities). Please bring someone with you to this appointment if possible, as it is helpful for the doctor to hear from both you and another adult who knows you well. Please bring eyeglasses and hearing aids if you wear them.    The evaluation will take approximately 3 hours and has two parts:   The first part is a clinical interview with the neuropsychologist (Dr. Melvyn Novas or Dr. Nicole Kindred). During the interview,  the neuropsychologist will speak with you and the individual you brought to the appointment.    The second part of the evaluation is testing with the doctor's technician Hinton Dyer or Maudie Mercury). During the testing, the technician will ask  you to remember different types of material, solve problems, and answer some questionnaires. Your family member will not be present for this portion of the evaluation.   Please note: We must reserve several hours of the neuropsychologist's time and the psychometrician's time for your evaluation appointment. As such, there is a No-Show fee of $100. If you are unable to attend any of your appointments, please contact our office as soon as possible to reschedule.    FALL PRECAUTIONS: Be cautious when walking. Scan the area for obstacles that may increase the risk of trips and falls. When getting up in the mornings, sit up at the edge of the bed for a few minutes before getting out of bed. Consider elevating the bed at the head end to avoid drop of blood pressure when getting up. Walk always in a well-lit room (use night lights in the walls). Avoid area rugs or power cords from appliances in the middle of the walkways. Use a walker or a cane if necessary and consider physical therapy for balance exercise. Get your eyesight checked regularly.  FINANCIAL OVERSIGHT: Supervision, especially oversight when making financial decisions or transactions is also recommended.  HOME SAFETY: Consider the safety of the kitchen when operating appliances like stoves, microwave oven, and blender. Consider having supervision and share cooking responsibilities until no longer able to participate in those. Accidents with firearms and other hazards in the house should be identified and addressed as well.   ABILITY TO BE LEFT ALONE: If patient is unable to contact 911 operator, consider using LifeLine, or when the need is there, arrange for someone to stay with patients. Smoking is a fire hazard, consider supervision or cessation. Risk of wandering should be assessed by caregiver and if detected at any point, supervision and safe proof recommendations should be instituted.  MEDICATION SUPERVISION: Inability to self-administer  medication needs to be constantly addressed. Implement a mechanism to ensure safe administration of the medications.   DRIVING: Regarding driving, in patients with progressive memory problems, driving will be impaired. We advise to have someone else do the driving if trouble finding directions or if minor accidents are reported. Independent driving assessment is available to determine safety of driving.   If you are interested in the driving assessment, you can contact the following:  [employer] in [place]  [place] [phone] or [phone]    [place] refers to food and lifestyle choices that are based on the traditions of countries located on the [employer]. This way of eating has been shown to help prevent certain conditions and improve outcomes for people who have chronic diseases, like kidney disease and heart disease. What are tips for following this plan? Lifestyle  Cook and eat meals together with your family, when possible. Drink enough fluid to keep your urine clear or pale yellow. Be physically active every day. This includes: Aerobic exercise like running or swimming. Leisure activities like gardening, walking, or housework. Get 7-8 hours of sleep each night. If recommended by your health care provider, drink red wine in moderation. This means 1 glass a day for nonpregnant women and 2 glasses a day for men. A glass of wine  equals 5 oz (150 mL). Reading food labels  Check the serving size of packaged foods. For foods such as rice and pasta, the serving size refers to the amount of cooked product, not dry. Check the total fat in packaged foods. Avoid foods that have saturated fat or trans fats. Check the ingredients list for added sugars, such as corn syrup. Shopping  At the grocery store, buy most of your food  from the areas near the walls of the store. This includes: Fresh fruits and vegetables (produce). Grains, beans, nuts, and seeds. Some of these may be available in unpackaged forms or large amounts (in bulk). Fresh seafood. Poultry and eggs. Low-fat dairy products. Buy whole ingredients instead of prepackaged foods. Buy fresh fruits and vegetables in-season from local farmers markets. Buy frozen fruits and vegetables in resealable bags. If you do not have access to quality fresh seafood, buy precooked frozen shrimp or canned fish, such as tuna, salmon, or sardines. Buy small amounts of raw or cooked vegetables, salads, or olives from the deli or salad bar at your store. Stock your pantry so you always have certain foods on hand, such as olive oil, canned tuna, canned tomatoes, rice, pasta, and beans. Cooking  Cook foods with extra-virgin olive oil instead of using butter or other vegetable oils. Have meat as a side dish, and have vegetables or grains as your main dish. This means having meat in small portions or adding small amounts of meat to foods like pasta or stew. Use beans or vegetables instead of meat in common dishes like chili or lasagna. Experiment with different cooking methods. Try roasting or broiling vegetables instead of steaming or sauteing them. Add frozen vegetables to soups, stews, pasta, or rice. Add nuts or seeds for added healthy fat at each meal. You can add these to yogurt, salads, or vegetable dishes. Marinate fish or vegetables using olive oil, lemon juice, garlic, and fresh herbs. Meal planning  Plan to eat 1 vegetarian meal one day each week. Try to work up to 2 vegetarian meals, if possible. Eat seafood 2 or more times a week. Have healthy snacks readily available, such as: Vegetable sticks with hummus. Greek yogurt. Fruit and nut trail mix. Eat balanced meals throughout the week. This includes: Fruit: 2-3 servings a day Vegetables: 4-5 servings a  day Low-fat dairy: 2 servings a day Fish, poultry, or lean meat: 1 serving a day Beans and legumes: 2 or more servings a week Nuts and seeds: 1-2 servings a day Whole grains: 6-8 servings a day Extra-virgin olive oil: 3-4 servings a day Limit red meat and sweets to only a few servings a month What are my food choices? Mediterranean diet Recommended Grains: Whole-grain pasta. Brown rice. Bulgar wheat. Polenta. Couscous. Whole-wheat bread. [place]. Vegetables: Artichokes. Beets. Broccoli. Cabbage. Carrots. Eggplant. Green beans. Chard. Kale. Spinach. Onions. Leeks. Peas. Squash. Tomatoes. Peppers. Radishes. Fruits: Apples. Apricots. Avocado. Berries. Bananas. Cherries. Dates. Figs. Grapes. Lemons. Melon. Oranges. Peaches. Plums. Pomegranate. Meats and other protein foods: Beans. Almonds. Sunflower seeds. Pine nuts. Peanuts. [place]. Salmon. Scallops. Shrimp. [place]. Tilapia. Clams. Oysters. Eggs. Dairy: Low-fat milk. Cheese. Greek yogurt. Beverages: Water. Red wine. Herbal tea. Fats and oils: Extra virgin olive oil. Avocado oil. Grape seed oil. Sweets and desserts: [place] yogurt with honey. Baked apples. Poached pears. Trail mix. Seasoning and other foods: Basil. Cilantro. Coriander. Cumin. Mint. Parsley. Sage. Rosemary. Tarragon. Garlic. Oregano. Thyme. Pepper. Balsalmic vinegar. Tahini. Hummus. Tomato sauce. Olives. Mushrooms. Limit these Grains: Prepackaged pasta or  rice dishes. Prepackaged cereal with added sugar. Vegetables: Deep fried potatoes (french fries). Fruits: Fruit canned in syrup. Meats and other protein foods: Beef. Pork. Lamb. Poultry with skin. Hot dogs. [place] Salines. Dairy: Ice cream. Sour cream. Whole milk. Beverages: Juice. Sugar-sweetened soft drinks. Beer. Liquor and spirits. Fats and oils: Butter. Canola oil. Vegetable oil. Beef fat (tallow). Lard. Sweets and desserts: Cookies. Cakes. Pies. Candy. Seasoning and other foods: Mayonnaise. Premade sauces and marinades. The  items listed may not be a complete list. Talk with your dietitian about what dietary choices are right for you. Summary The Mediterranean diet includes both food and lifestyle choices. Eat a variety of fresh fruits and vegetables, beans, nuts, seeds, and whole grains. Limit the amount of red meat and sweets that you eat. Talk with your health care provider about whether it is safe for you to drink red wine in moderation. This means 1 glass a day for nonpregnant women and 2 glasses a day for men. A glass of wine equals 5 oz (150 mL). This information is not intended to replace advice given to you by your health care provider. Make sure you discuss any questions you have with your health care provider. Document Released: 12/01/2015 Document Revised: 01/03/2016 Document Reviewed: 12/01/2015 Elsevier Interactive Patient Education  2017 [employer].  We have sent a referral to [facility] for your MRI and they will call you directly to schedule your appointment. They are located at [place]. If you need to contact them directly please call [phone].  Your provider has requested that you have labwork completed today. Please go to [facility] ([id number]) on the second floor of this building before leaving the office today. You do not need to check in. If you are not called within 15 minutes please check with the front desk.
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
## 2022-01-29 ENCOUNTER — Telehealth: Payer: Self-pay | Admitting: Physician Assistant

## 2022-01-29 NOTE — Progress Notes (Signed)
B12 is on the lower normal, recommend replenish B12 at 1000 mcg daily and follow with PCP. Thyroid levels are normal. Thanks

## 2022-01-29 NOTE — Telephone Encounter (Signed)
Patient would like the results of the blood work

## 2022-01-29 NOTE — Telephone Encounter (Signed)
Called patient regarding labs, voiced understanding and thanked me for calling

## 2022-02-17 ENCOUNTER — Other Ambulatory Visit: Payer: Non-veteran care

## 2022-02-28 ENCOUNTER — Ambulatory Visit: Payer: Non-veteran care | Admitting: Physician Assistant

## 2022-03-01 ENCOUNTER — Ambulatory Visit: Payer: Non-veteran care | Admitting: Physician Assistant

## 2022-03-17 ENCOUNTER — Ambulatory Visit
Admission: RE | Admit: 2022-03-17 | Discharge: 2022-03-17 | Disposition: A | Discharge status: Home / Self Care | Payer: Medicare Other | Source: Ambulatory Visit | Attending: Physician Assistant | Admitting: Physician Assistant

## 2022-03-17 MED ORDER — GADOPICLENOL 0.5 MMOL/ML IV SOLN
10.0000 mL | Freq: Once | INTRAVENOUS | Status: AC | PRN
  Administered 2022-03-17: 10 mL via INTRAVENOUS

## 2022-03-19 NOTE — Progress Notes (Signed)
MRI brain shows . Mild age related volume loss and very minimal age related changes in the circulation., no acute findings, thanks

## 2022-03-23 ENCOUNTER — Encounter: Payer: Self-pay | Admitting: Physician Assistant

## 2022-03-23 ENCOUNTER — Ambulatory Visit (INDEPENDENT_AMBULATORY_CARE_PROVIDER_SITE_OTHER): Payer: No Typology Code available for payment source | Admitting: Physician Assistant

## 2022-03-23 VITALS — BP 143/81 | HR 81 | Resp 18 | Ht 66.0 in | Wt 256.0 lb

## 2022-03-23 DIAGNOSIS — R413 Other amnesia: Secondary | ICD-10-CM
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
## 2022-03-23 NOTE — Patient Instructions (Signed)
swallowingIt was a pleasure to see you today at our office.   Recommendations:  Limit muscle relaxer use Follow up after the neuropsych evaluation  Neuropsych testing for clarity of diagnosis    Whom to call:  Memory  decline, memory medications: Call our office [phone]   For psychiatric meds, mood meds: Please have your primary care physician manage these medications.    For assessment of decision of mental capacity and competency:  Call Dr. Anthoney Harada, geriatric psychiatrist at [phone]  For guidance in geriatric dementia issues please call [facility] [phone]     If you have any severe symptoms of a stroke, or other severe issues such as confusion,severe chills or fever, etc call 911 or go to the ER as you may need to be evaluated further   Feel free to visit Facebook page " [facility]" for tips of how to care for people with memory problems.       RECOMMENDATIONS FOR ALL PATIENTS WITH MEMORY PROBLEMS: 1. Continue to exercise (Recommend 30 minutes of walking everyday, or 3 hours every week) 2. Increase social interactions - continue going to [place] and enjoy social gatherings with friends and family 3. Eat healthy, avoid fried foods and eat more fruits and vegetables 4. Maintain adequate blood pressure, blood sugar, and blood cholesterol level. Reducing the risk of stroke and cardiovascular disease also helps promoting better memory. 5. Avoid stressful situations. Live a simple life and avoid aggravations. Organize your time and prepare for the next day in anticipation. 6. Sleep well, avoid any interruptions of sleep and avoid any distractions in the bedroom that may interfere with adequate sleep quality 7. Avoid sugar, avoid sweets as there is a strong link between excessive sugar intake, diabetes, and cognitive impairment We discussed the Mediterranean diet, which has been shown to help patients reduce the risk of progressive memory disorders and  reduces cardiovascular risk. This includes eating fish, eat fruits and green leafy vegetables, nuts like almonds and hazelnuts, walnuts, and also use olive oil. Avoid fast foods and fried foods as much as possible. Avoid sweets and sugar as sugar use has been linked to worsening of memory function.  There is always a concern of gradual progression of memory problems. If this is the case, then we may need to adjust level of care according to patient needs. Support, both to the patient and caregiver, should then be put into place.      You have been referred for a neuropsychological evaluation (i.e., evaluation of memory and thinking abilities). Please bring someone with you to this appointment if possible, as it is helpful for the doctor to hear from both you and another adult who knows you well. Please bring eyeglasses and hearing aids if you wear them.    The evaluation will take approximately 3 hours and has two parts:   The first part is a clinical interview with the neuropsychologist (Dr. Melvyn Novas or Dr. Nicole Kindred). During the interview, the neuropsychologist will speak with you and the individual you brought to the appointment.    The second part of the evaluation is testing with the doctor's technician Hinton Dyer or Maudie Mercury). During the testing, the technician will ask you to remember different types of material, solve problems, and answer some questionnaires. Your family member will not be present for this portion of the evaluation.   Please note: We must reserve several hours of the neuropsychologist's time and the psychometrician's time for your evaluation appointment. As such, there is a  No-Show fee of $100. If you are unable to attend any of your appointments, please contact our office as soon as possible to reschedule.    FALL PRECAUTIONS: Be cautious when walking. Scan the area for obstacles that may increase the risk of trips and falls. When getting up in the mornings, sit up at the edge of the bed  for a few minutes before getting out of bed. Consider elevating the bed at the head end to avoid drop of blood pressure when getting up. Walk always in a well-lit room (use night lights in the walls). Avoid area rugs or power cords from appliances in the middle of the walkways. Use a walker or a cane if necessary and consider physical therapy for balance exercise. Get your eyesight checked regularly.  FINANCIAL OVERSIGHT: Supervision, especially oversight when making financial decisions or transactions is also recommended.  HOME SAFETY: Consider the safety of the kitchen when operating appliances like stoves, microwave oven, and blender. Consider having supervision and share cooking responsibilities until no longer able to participate in those. Accidents with firearms and other hazards in the house should be identified and addressed as well.   ABILITY TO BE LEFT ALONE: If patient is unable to contact 911 operator, consider using LifeLine, or when the need is there, arrange for someone to stay with patients. Smoking is a fire hazard, consider supervision or cessation. Risk of wandering should be assessed by caregiver and if detected at any point, supervision and safe proof recommendations should be instituted.  MEDICATION SUPERVISION: Inability to self-administer medication needs to be constantly addressed. Implement a mechanism to ensure safe administration of the medications.   DRIVING: Regarding driving, in patients with progressive memory problems, driving will be impaired. We advise to have someone else do the driving if trouble finding directions or if minor accidents are reported. Independent driving assessment is available to determine safety of driving.   If you are interested in the driving assessment, you can contact the following:  [employer] in [place]  [place]  [phone] or [phone]    [place] refers to food and lifestyle choices that are based on the traditions of countries located on the [employer]. This way of eating has been shown to help prevent certain conditions and improve outcomes for people who have chronic diseases, like kidney disease and heart disease. What are tips for following this plan? Lifestyle  Cook and eat meals together with your family, when possible. Drink enough fluid to keep your urine clear or pale yellow. Be physically active every day. This includes: Aerobic exercise like running or swimming. Leisure activities like gardening, walking, or housework. Get 7-8 hours of sleep each night. If recommended by your health care provider, drink red wine in moderation. This means 1 glass a day for nonpregnant women and 2 glasses a day for men. A glass of wine equals 5 oz (150 mL). Reading food labels  Check the serving size of packaged foods. For foods such as rice and pasta, the serving size refers to the amount of cooked product, not dry. Check the total fat in packaged foods. Avoid foods that have saturated fat or trans fats. Check the ingredients list for added sugars, such as corn syrup. Shopping  At the grocery store, buy most of your food from the areas near the walls of the store. This includes: Fresh fruits and vegetables (produce). Grains, beans,  nuts, and seeds. Some of these may be available in unpackaged forms or large amounts (in bulk). Fresh seafood. Poultry and eggs. Low-fat dairy products. Buy whole ingredients instead of prepackaged foods. Buy fresh fruits and vegetables in-season from local farmers markets. Buy frozen fruits and vegetables in resealable bags. If you do not have access to quality fresh seafood, buy precooked frozen shrimp or canned fish, such as tuna, salmon, or sardines. Buy small amounts of raw or cooked vegetables, salads, or olives from the  deli or salad bar at your store. Stock your pantry so you always have certain foods on hand, such as olive oil, canned tuna, canned tomatoes, rice, pasta, and beans. Cooking  Cook foods with extra-virgin olive oil instead of using butter or other vegetable oils. Have meat as a side dish, and have vegetables or grains as your main dish. This means having meat in small portions or adding small amounts of meat to foods like pasta or stew. Use beans or vegetables instead of meat in common dishes like chili or lasagna. Experiment with different cooking methods. Try roasting or broiling vegetables instead of steaming or sauteing them. Add frozen vegetables to soups, stews, pasta, or rice. Add nuts or seeds for added healthy fat at each meal. You can add these to yogurt, salads, or vegetable dishes. Marinate fish or vegetables using olive oil, lemon juice, garlic, and fresh herbs. Meal planning  Plan to eat 1 vegetarian meal one day each week. Try to work up to 2 vegetarian meals, if possible. Eat seafood 2 or more times a week. Have healthy snacks readily available, such as: Vegetable sticks with hummus. Greek yogurt. Fruit and nut trail mix. Eat balanced meals throughout the week. This includes: Fruit: 2-3 servings a day Vegetables: 4-5 servings a day Low-fat dairy: 2 servings a day Fish, poultry, or lean meat: 1 serving a day Beans and legumes: 2 or more servings a week Nuts and seeds: 1-2 servings a day Whole grains: 6-8 servings a day Extra-virgin olive oil: 3-4 servings a day Limit red meat and sweets to only a few servings a month What are my food choices? Mediterranean diet Recommended Grains: Whole-grain pasta. Brown rice. Bulgar wheat. Polenta. Couscous. Whole-wheat bread. [place]. Vegetables: Artichokes. Beets. Broccoli. Cabbage. Carrots. Eggplant. Green beans. Chard. Kale. Spinach. Onions. Leeks. Peas. Squash. Tomatoes. Peppers. Radishes. Fruits: Apples. Apricots.  Avocado. Berries. Bananas. Cherries. Dates. Figs. Grapes. Lemons. Melon. Oranges. Peaches. Plums. Pomegranate. Meats and other protein foods: Beans. Almonds. Sunflower seeds. Pine nuts. Peanuts. [place]. Salmon. Scallops. Shrimp. [place]. Tilapia. Clams. Oysters. Eggs. Dairy: Low-fat milk. Cheese. Greek yogurt. Beverages: Water. Red wine. Herbal tea. Fats and oils: Extra virgin olive oil. Avocado oil. Grape seed oil. Sweets and desserts: [place] yogurt with honey. Baked apples. Poached pears. Trail mix. Seasoning and other foods: Basil. Cilantro. Coriander. Cumin. Mint. Parsley. Sage. Rosemary. Tarragon. Garlic. Oregano. Thyme. Pepper. Balsalmic vinegar. Tahini. Hummus. Tomato sauce. Olives. Mushrooms. Limit these Grains: Prepackaged pasta or rice dishes. Prepackaged cereal with added sugar. Vegetables: Deep fried potatoes (french fries). Fruits: Fruit canned in syrup. Meats and other protein foods: Beef. Pork. Lamb. Poultry with skin. Hot dogs. [place] Salines. Dairy: Ice cream. Sour cream. Whole milk. Beverages: Juice. Sugar-sweetened soft drinks. Beer. Liquor and spirits. Fats and oils: Butter. Canola oil. Vegetable oil. Beef fat (tallow). Lard. Sweets and desserts: Cookies. Cakes. Pies. Candy. Seasoning and other foods: Mayonnaise. Premade sauces and marinades. The items listed may not be a complete list. Talk with your dietitian about what  dietary choices are right for you. Summary The Mediterranean diet includes both food and lifestyle choices. Eat a variety of fresh fruits and vegetables, beans, nuts, seeds, and whole grains. Limit the amount of red meat and sweets that you eat. Talk with your health care provider about whether it is safe for you to drink red wine in moderation. This means 1 glass a day for nonpregnant women and 2 glasses a day for men. A glass of wine equals 5 oz (150 mL). This information is not intended to replace advice given to you by your health care provider. Make sure you discuss  any questions you have with your health care provider. Document Released: 12/01/2015 Document Revised: 01/03/2016 Document Reviewed: 12/01/2015 Elsevier Interactive Patient Education  2017 [employer].  We have sent a referral to [facility] for your MRI and they will call you directly to schedule your appointment. They are located at [place]. If you need to contact them directly please call [phone].  Your provider has requested that you have labwork completed today. Please go to [facility] ([id number]) on the second floor of this building before leaving the office today. You do not need to check in. If you are not called within 15 minutes please check with the front desk.

## 2022-07-12 ENCOUNTER — Telehealth: Payer: Self-pay

## 2022-07-12 NOTE — Patient Outreach (Signed)
  Care Coordination   07/12/2022 Name: Joseph Khan MRN: [id number] DOB: May 22, 1945   Care Coordination Outreach Attempts:  An unsuccessful telephone outreach was attempted today to offer the patient information about available care coordination services as a benefit of their health plan.   Follow Up Plan:  Additional outreach attempts will be made to offer the patient care coordination information and services.   Encounter Outcome:  No Answer   Care Coordination Interventions:  No, not indicated    SIG Peter Garter RN, BSN,CCM, CDE Care Management Coordinator [facility] [phone]

## 2022-07-26 ENCOUNTER — Telehealth: Payer: Self-pay

## 2022-07-26 NOTE — Patient Outreach (Signed)
  Care Coordination   07/26/2022 Name: Joseph Khan MRN: [id number] DOB: [birth year]   Care Coordination Outreach Attempts:  A second unsuccessful outreach was attempted today to offer the patient with information about available care coordination services as a benefit of their health plan.     Follow Up Plan:  Additional outreach attempts will be made to offer the patient care coordination information and services.   Encounter Outcome:  No Answer   Care Coordination Interventions:  No, not indicated    SIG  Peter Garter RN, BSN,CCM, CDE Care Management Coordinator [facility] [phone]

## 2022-08-08 ENCOUNTER — Telehealth: Payer: Self-pay

## 2022-08-08 NOTE — Patient Outreach (Signed)
  Care Coordination   Initial Visit Note   08/08/2022 Name: Corbett Moulder MRN: [id number] DOB: Nov 19, 1945  Kunta Hilleary is a 77 y.o. year old male who sees Irven Coe, MD for primary care. I spoke with  Tomma Lightning by phone today.  What matters to the patients health and wellness today?  No concerns today.  States he gets most of his care at the [facility].  Reports checking B/P at home regularly and last result 138/68    Goals Addressed             This Visit's Progress    COMPLETED: Care Coordination Activities- No Follow Up Required       Interventions Today    Flowsheet Row Most Recent Value  Chronic Disease   Chronic disease during today's visit Hypertension (HTN)  General Interventions   General Interventions Discussed/Reviewed General Interventions Discussed, Doctor Visits, Durable Medical Equipment (DME)  [Declines needing any further RNCM interventions]  Doctor Visits Discussed/Reviewed Doctor Visits Discussed, Annual Wellness Visits, PCP  Durable Medical Equipment (DME) BP Cuff  [Reviewed to continue checking B/P daily and to report any abnormal findings to provider]  PCP/Specialist Visits Compliance with follow-up visit  [completed annual wellness visit on 08/06/22]  Education Interventions   Education Provided Provided Education  Provided Verbal Education On Nutrition, When to see the doctor, Other  [care coordination services]  Nutrition Interventions   Nutrition Discussed/Reviewed Nutrition Discussed, Decreasing salt  Pharmacy Interventions   Pharmacy Dicussed/Reviewed Pharmacy Topics Discussed, Medications and their functions              SDOH assessments and interventions completed:  Yes  SDOH Interventions Today    Flowsheet Row Most Recent Value  SDOH Interventions   Food Insecurity Interventions Intervention Not Indicated  Housing Interventions Intervention Not Indicated  Transportation Interventions Intervention Not Indicated  Utilities Interventions  Intervention Not Indicated        Care Coordination Interventions:  Yes, provided   Follow up plan: No further intervention required.   Encounter Outcome:  Pt. Visit Completed  Dudley Major RN, BSN,CCM, CDE Care Management Coordinator [facility] [phone]

## 2022-08-08 NOTE — Patient Instructions (Signed)
Visit Information  Thank you for taking time to visit with me today. Please don't hesitate to contact me if I can be of assistance to you.   Following are the goals we discussed today:   Goals Addressed             This Visit's Progress    COMPLETED: Care Coordination Activities- No Follow Up Required       Interventions Today    Flowsheet Row Most Recent Value  Chronic Disease   Chronic disease during today's visit Hypertension (HTN)  General Interventions   General Interventions Discussed/Reviewed General Interventions Discussed, Doctor Visits, Durable Medical Equipment (DME)  [Declines needing any further RNCM interventions]  Doctor Visits Discussed/Reviewed Doctor Visits Discussed, Annual Wellness Visits, PCP  Durable Medical Equipment (DME) BP Cuff  [Reviewed to continue checking B/P daily and to report any abnormal findings to provider]  PCP/Specialist Visits Compliance with follow-up visit  [completed annual wellness visit on 08/06/22]  Education Interventions   Education Provided Provided Education  Provided Verbal Education On Nutrition, When to see the doctor, Other  [care coordination services]  Nutrition Interventions   Nutrition Discussed/Reviewed Nutrition Discussed, Decreasing salt  Pharmacy Interventions   Pharmacy Dicussed/Reviewed Pharmacy Topics Discussed, Medications and their functions               If you are experiencing a Mental Health or Behavioral Health Crisis or need someone to talk to, please call the Suicide and Crisis Lifeline: 988 call the [place] National Suicide Prevention Lifeline: 386-042-2809 or TTY: 440-707-1780 TTY 410-245-2178) to talk to a trained counselor call 1-800-273-TALK (toll free, 24 hour hotline) go to [facility] [address] [place] [phone]) call 911   Patient verbalizes understanding of instructions and care plan provided today and agrees to view in MyChart. Active MyChart  status and patient understanding of how to access instructions and care plan via MyChart confirmed with patient.     No further follow up required:    SIGNATURE Dudley Major RN, Maximiano Coss, CDE Care Management Coordinator [facility] [phone]

## 2022-08-10 DIAGNOSIS — M25662 Stiffness of left knee, not elsewhere classified: Secondary | ICD-10-CM | POA: Insufficient documentation

## 2022-08-10 HISTORY — DX: Stiffness of left knee, not elsewhere classified: M25.662

## 2022-09-05 ENCOUNTER — Encounter: Payer: Non-veteran care | Admitting: Psychology

## 2022-09-12 ENCOUNTER — Encounter: Payer: Self-pay | Admitting: Psychology

## 2022-09-24 ENCOUNTER — Ambulatory Visit: Payer: Self-pay | Admitting: Physician Assistant

## 2023-01-01 ENCOUNTER — Other Ambulatory Visit: Payer: Self-pay | Admitting: Nephrology

## 2023-01-01 DIAGNOSIS — N1831 Chronic kidney disease, stage 3a: Secondary | ICD-10-CM

## 2023-01-02 ENCOUNTER — Ambulatory Visit
Admission: RE | Admit: 2023-01-02 | Discharge: 2023-01-02 | Disposition: A | Discharge status: Home / Self Care | Payer: Medicare Other | Source: Ambulatory Visit | Attending: Nephrology | Admitting: Nephrology

## 2023-01-02 DIAGNOSIS — N1831 Chronic kidney disease, stage 3a: Secondary | ICD-10-CM

## 2023-02-06 ENCOUNTER — Other Ambulatory Visit (HOSPITAL_COMMUNITY): Payer: Self-pay | Admitting: Nephrology

## 2023-02-06 DIAGNOSIS — R809 Proteinuria, unspecified: Secondary | ICD-10-CM

## 2023-02-06 DIAGNOSIS — N1831 Chronic kidney disease, stage 3a: Secondary | ICD-10-CM

## 2023-02-22 ENCOUNTER — Other Ambulatory Visit (HOSPITAL_COMMUNITY): Payer: Self-pay | Admitting: Student

## 2023-02-22 DIAGNOSIS — N1831 Chronic kidney disease, stage 3a: Secondary | ICD-10-CM

## 2023-02-22 NOTE — Progress Notes (Signed)
Chief Complaint: CKD with nephrotic range proteinuria  Referring Provider(s): Marisue Humble  Supervising Physician: Richarda Overlie  Patient Status: [facility] - Out-pt  History of Present Illness: Joseph Khan is a 77 y.o. 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  He is here today for random renal biopsy.  He is NPO. No nausea/vomiting. No Fever/chills. ROS negative. 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  Medications: Prior to Admission medications   Medication Sig Start Date End Date Taking? Authorizing Provider  amLODipine (NORVASC) 5 MG tablet  10/27/19   [provider]  aspirin 81 MG chewable tablet Chew 1 tablet (81 mg total) by mouth 2 (two) times daily. Patient not taking: Reported on 08/01/2020 01/17/16   [place], [facility], PA-C  benazepril (LOTENSIN) 20 MG tablet Take 20 mg by mouth daily. 06/23/20   [provider]  cetirizine (ZYRTEC) 10 MG tablet Take by mouth.    [provider]  chlorpheniramine (CHLOR-TRIMETON) 4 MG tablet Take 4 mg by mouth every 6 (six) hours as needed for allergies.    [provider]  Cholecalciferol (VITAMIN D3) 3000 units TABS Take 3,000 Units by mouth daily.    [provider]  diclofenac (VOLTAREN) 75 MG  EC tablet Take 1 tablet (75 mg total) by mouth 2 (two) times daily. Use as needed after completion of prednisone course 11/09/20   Wieters, Hallie C, PA-C  FLUoxetine (PROZAC) 20 MG capsule Take 20 mg by mouth daily. Patient not taking: Reported on 03/23/2022 08/06/20   [provider]  fluticasone (FLONASE) 50 MCG/ACT nasal spray Place into both nostrils daily.    [provider]  omeprazole (PRILOSEC) 20 MG capsule Take 20 mg by mouth daily.    [provider]  predniSONE (DELTASONE) 10 MG tablet Begin with 6 tabs on day 1, 5 tab on day 2, 4 tab on day 3, 3 tab on day 4, 2 tab on day 5, 1 tab on day 6-take with food Patient not taking: Reported on 01/26/2022 11/09/20   Sharyon Cable, Fran Lowes C, PA-C  PSYLLIUM PO  07/25/20   [provider]  sodium chloride (OCEAN) 0.65 % nasal spray Place into the nose. 03/02/19   [provider]  Testosterone 20.25 MG/ACT (1.62%) GEL APPLY 2 PUMPS TO SKIN DAILY (APPLY AFTER SHOWER OR BATH TO CLEAN, DRY, INTACT SKIN ON UPPER ARM OR SHOULDER AREAS ONLY) 08/12/20   [provider]  tiZANidine (ZANAFLEX) 2 MG tablet Take 1-2 tablets (2-4 mg total) by mouth every 6 (six) hours as needed for muscle spasms. 11/09/20   Wieters, Hallie C, PA-C  traZODone (DESYREL) 150 MG tablet Take 150 mg by mouth at bedtime.  [provider]     Family History  Problem Relation Age of Onset   CVA Mother    Other Father    Colon cancer Neg Hx    Colon polyps Neg Hx    Esophageal cancer Neg Hx    Rectal cancer Neg Hx    Stomach cancer Neg Hx     Social History   Socioeconomic History   Marital status: Married    Spouse name: Not on file   Number of children: 4   Years of education: 14   Highest education level: Not on file  Occupational History   Not on file  Tobacco Use   Smoking status: Former    Types: Cigars    Quit date: 01/10/1982    Years since quitting: 41.1   Smokeless tobacco: Former    Types: Chew    Quit  date: 01/15/1989  Vaping Use   Vaping status: Never Used  Substance and Sexual Activity   Alcohol use: No   Drug use: No   Sexual activity: Yes  Other Topics Concern   Not on file  Social History Narrative   Right handed   Drinks caffeine   Two story level   Social Determinants of Health   Financial Resource Strain: Not on file  Food Insecurity: No Food Insecurity (08/08/2022)   Hunger Vital Sign    Worried About Running Out of Food in the Last Year: Never true    Ran Out of Food in the Last Year: Never true  Transportation Needs: No Transportation Needs (08/08/2022)   PRAPARE - Administrator, Civil Service (Medical): No    Lack of Transportation (Non-Medical): No  Physical Activity: Not on file  Stress: Not on file  Social Connections: Not on file     Review of Systems: A 12 point ROS discussed and pertinent positives are indicated in the HPI above.  All other systems are negative.  Review of Systems  Vital Signs: There were no vitals taken for this visit.  Advance Care Plan: The advanced care place/surrogate decision maker was discussed at the time of visit and the patient did not wish to discuss or was not able to name a surrogate decision maker or provide an advance care plan.  Physical Exam Vitals reviewed.  Constitutional:      Appearance: Normal appearance.  HENT:     Head: Normocephalic and atraumatic.  Eyes:     Extraocular Movements: Extraocular movements intact.  Cardiovascular:     Rate and Rhythm: Normal rate and regular rhythm.  Pulmonary:     Effort: Pulmonary effort is normal. No respiratory distress.     Breath sounds: Normal breath sounds.  Abdominal:     General: There is no distension.     Palpations: Abdomen is soft.     Tenderness: There is no abdominal tenderness.  Musculoskeletal:        General: Normal range of motion.     Cervical back: Normal range of motion.  Skin:    General: Skin is warm and dry.  Neurological:      General: No focal deficit present.     Mental Status: He is alert and oriented to person, place, and time.  Psychiatric:        Mood and Affect: Mood normal.        Behavior: Behavior normal.        Thought Content: Thought content normal.        Judgment: Judgment  normal.     Imaging: No results found.  Labs:  CBC: No results for input(s): "WBC", "HGB", "HCT", "PLT" in the last 8760 hours.  COAGS: No results for input(s): "INR", "APTT" in the last 8760 hours.  BMP: No results for input(s): "NA", "K", "CL", "CO2", "GLUCOSE", "BUN", "CALCIUM", "CREATININE", "GFRNONAA", "GFRAA" in the last 8760 hours.  Invalid input(s): "CMP"  LIVER FUNCTION TESTS: No results for input(s): "BILITOT", "AST", "ALT", "ALKPHOS", "PROT", "ALBUMIN" in the last 8760 hours.  TUMOR MARKERS: No results for input(s): "AFPTM", "CEA", "CA199", "CHROMGRNA" in the last 8760 hours.  Assessment and Plan:  CKD with nephrotic range proteinuria.  Will proceed with image guided random renal biopsy today by Dr. Lowella Dandy.  Risks and benefits of random renal biopsy was discussed with the patient and/or patient's family including, but not limited to bleeding, infection, damage to adjacent structures or low yield requiring additional tests.  All of the questions were answered and there is agreement to proceed.  Consent signed and in chart.   Thank you for allowing our service to participate in Joseph Khan 's care.  Electronically Signed: Gwynneth Macleod, PA-C   02/22/2023, 3:48 PM      I spent a total of  30 Minutes   in face to face in clinical consultation, greater than 50% of which was counseling/coordinating care for random renal biopsy.

## 2023-02-25 ENCOUNTER — Ambulatory Visit (HOSPITAL_COMMUNITY)
Admission: RE | Admit: 2023-02-25 | Discharge: 2023-02-25 | Disposition: A | Discharge status: Home / Self Care | Payer: Medicare Other | Source: Ambulatory Visit | Attending: Nephrology | Admitting: Nephrology

## 2023-02-25 ENCOUNTER — Other Ambulatory Visit (HOSPITAL_COMMUNITY): Payer: Self-pay | Admitting: Nephrology

## 2023-02-25 ENCOUNTER — Other Ambulatory Visit: Payer: Self-pay

## 2023-02-25 DIAGNOSIS — R809 Proteinuria, unspecified: Secondary | ICD-10-CM

## 2023-02-25 DIAGNOSIS — I129 Hypertensive chronic kidney disease with stage 1 through stage 4 chronic kidney disease, or unspecified chronic kidney disease: Secondary | ICD-10-CM | POA: Insufficient documentation

## 2023-02-25 DIAGNOSIS — N1831 Chronic kidney disease, stage 3a: Secondary | ICD-10-CM

## 2023-02-25 DIAGNOSIS — Z01812 Encounter for preprocedural laboratory examination: Secondary | ICD-10-CM | POA: Diagnosis present

## 2023-02-25 LAB — CBC
HCT: 37.4 % — ABNORMAL LOW (ref 39.0–52.0)
Hemoglobin: 12.7 g/dL — ABNORMAL LOW (ref 13.0–17.0)
MCH: 31.5 pg (ref 26.0–34.0)
MCHC: 34 g/dL (ref 30.0–36.0)
MCV: 92.8 fL (ref 80.0–100.0)
Platelets: 283 10*3/uL (ref 150–400)
RBC: 4.03 MIL/uL — ABNORMAL LOW (ref 4.22–5.81)
RDW: 11.5 % (ref 11.5–15.5)
WBC: 6.2 10*3/uL (ref 4.0–10.5)
nRBC: 0 % (ref 0.0–0.2)

## 2023-02-25 LAB — PROTIME-INR
INR: 1.1 (ref 0.8–1.2)
Prothrombin Time: 14 s (ref 11.4–15.2)

## 2023-02-25 MED ORDER — SODIUM CHLORIDE 0.9 % IV SOLN: INTRAVENOUS | Status: DC

## 2023-02-25 MED ORDER — FENTANYL CITRATE (PF) 100 MCG/2ML IJ SOLN
INTRAMUSCULAR | Status: AC
  Filled 2023-02-25: qty 4

## 2023-02-25 MED ORDER — HYDROCODONE-ACETAMINOPHEN 5-325 MG PO TABS
1.0000 | ORAL_TABLET | ORAL | Status: DC | PRN
Start: 2023-02-25 — End: 2023-02-26

## 2023-02-25 MED ORDER — FENTANYL CITRATE (PF) 100 MCG/2ML IJ SOLN
INTRAMUSCULAR | Status: AC | PRN
  Administered 2023-02-25: 50 ug via INTRAVENOUS

## 2023-02-25 MED ORDER — HYDRALAZINE HCL 20 MG/ML IJ SOLN
INTRAMUSCULAR | Status: AC
  Filled 2023-02-25: qty 1

## 2023-02-25 MED ORDER — HYDRALAZINE HCL 20 MG/ML IJ SOLN
INTRAMUSCULAR | Status: AC | PRN
  Administered 2023-02-25: 10 mg via INTRAVENOUS

## 2023-02-25 MED ORDER — MIDAZOLAM HCL 2 MG/2ML IJ SOLN
INTRAMUSCULAR | Status: AC | PRN
  Administered 2023-02-25 (×2): .5 mg via INTRAVENOUS

## 2023-02-25 MED ORDER — MIDAZOLAM HCL 2 MG/2ML IJ SOLN
INTRAMUSCULAR | Status: AC
  Filled 2023-02-25: qty 4

## 2023-02-25 MED ORDER — LIDOCAINE HCL 1 % IJ SOLN
10.0000 mL | Freq: Once | INTRAMUSCULAR | Status: AC
  Administered 2023-02-25: 10 mL via INTRADERMAL

## 2023-02-25 NOTE — Procedures (Signed)
  Procedure:  CT core biopsy LLP renal 18g x2 Preprocedure diagnosis: Diagnoses of Chronic kidney disease, stage 3a (HCC), Nephrotic range proteinuria, and Stage 3a chronic kidney disease (HCC) were pertinent to this visit. Postprocedure diagnosis: same EBL:    minimal Complications:   none immediate  See full dictation in [employer].  Thora Lance MD Main # [phone] Pager  [phone] Mobile [phone]

## 2023-03-06 ENCOUNTER — Encounter (HOSPITAL_COMMUNITY): Payer: Self-pay

## 2023-03-08 LAB — SURGICAL PATHOLOGY

## 2023-05-31 ENCOUNTER — Encounter: Payer: Self-pay | Admitting: Psychology

## 2023-05-31 DIAGNOSIS — E669 Obesity, unspecified: Secondary | ICD-10-CM | POA: Insufficient documentation

## 2023-05-31 DIAGNOSIS — I10 Essential (primary) hypertension: Secondary | ICD-10-CM | POA: Insufficient documentation

## 2023-05-31 DIAGNOSIS — L299 Pruritus, unspecified: Secondary | ICD-10-CM | POA: Insufficient documentation

## 2023-05-31 DIAGNOSIS — M7661 Achilles tendinitis, right leg: Secondary | ICD-10-CM

## 2023-05-31 DIAGNOSIS — K036 Deposits [accretions] on teeth: Secondary | ICD-10-CM | POA: Insufficient documentation

## 2023-05-31 DIAGNOSIS — K0262 Dental caries on smooth surface penetrating into dentin: Secondary | ICD-10-CM | POA: Insufficient documentation

## 2023-05-31 DIAGNOSIS — J338 Other polyp of sinus: Secondary | ICD-10-CM | POA: Insufficient documentation

## 2023-05-31 DIAGNOSIS — G4733 Obstructive sleep apnea (adult) (pediatric): Secondary | ICD-10-CM | POA: Insufficient documentation

## 2023-05-31 DIAGNOSIS — G8929 Other chronic pain: Secondary | ICD-10-CM | POA: Insufficient documentation

## 2023-05-31 DIAGNOSIS — M1990 Unspecified osteoarthritis, unspecified site: Secondary | ICD-10-CM | POA: Insufficient documentation

## 2023-05-31 DIAGNOSIS — K0252 Dental caries on pit and fissure surface penetrating into dentin: Secondary | ICD-10-CM | POA: Insufficient documentation

## 2023-05-31 DIAGNOSIS — J300 Vasomotor rhinitis: Secondary | ICD-10-CM | POA: Insufficient documentation

## 2023-05-31 DIAGNOSIS — H35373 Puckering of macula, bilateral: Secondary | ICD-10-CM | POA: Insufficient documentation

## 2023-05-31 DIAGNOSIS — Z77098 Contact with and (suspected) exposure to other hazardous, chiefly nonmedicinal, chemicals: Secondary | ICD-10-CM | POA: Insufficient documentation

## 2023-05-31 DIAGNOSIS — H2513 Age-related nuclear cataract, bilateral: Secondary | ICD-10-CM | POA: Insufficient documentation

## 2023-05-31 DIAGNOSIS — L301 Dyshidrosis [pompholyx]: Secondary | ICD-10-CM | POA: Insufficient documentation

## 2023-05-31 DIAGNOSIS — J310 Chronic rhinitis: Secondary | ICD-10-CM | POA: Insufficient documentation

## 2023-05-31 DIAGNOSIS — I8310 Varicose veins of unspecified lower extremity with inflammation: Secondary | ICD-10-CM | POA: Insufficient documentation

## 2023-05-31 DIAGNOSIS — K219 Gastro-esophageal reflux disease without esophagitis: Secondary | ICD-10-CM | POA: Insufficient documentation

## 2023-05-31 DIAGNOSIS — F4312 Post-traumatic stress disorder, chronic: Secondary | ICD-10-CM | POA: Insufficient documentation

## 2023-05-31 DIAGNOSIS — N62 Hypertrophy of breast: Secondary | ICD-10-CM | POA: Insufficient documentation

## 2023-05-31 DIAGNOSIS — E291 Testicular hypofunction: Secondary | ICD-10-CM | POA: Insufficient documentation

## 2023-05-31 DIAGNOSIS — L209 Atopic dermatitis, unspecified: Secondary | ICD-10-CM | POA: Insufficient documentation

## 2023-05-31 HISTORY — DX: Achilles tendinitis, right leg: M76.61

## 2023-05-31 HISTORY — DX: Vasomotor rhinitis: J30.0

## 2023-06-03 ENCOUNTER — Ambulatory Visit: Payer: Medicare Other

## 2023-06-03 ENCOUNTER — Ambulatory Visit (INDEPENDENT_AMBULATORY_CARE_PROVIDER_SITE_OTHER): Payer: Self-pay | Admitting: Psychology

## 2023-06-03 DIAGNOSIS — R413 Other amnesia: Secondary | ICD-10-CM

## 2023-06-03 NOTE — Progress Notes (Signed)
   Neuropsychology Note [place]. [facility] [place] Department of Neurology     Our office was unable to obtain authorization from the [place] prior to his appointment time. As he did not wish to risk paying out of pocket, he was rescheduled at his earliest convenience.  As he arrived to his appointment on time and was willing to engage in the evaluation this morning outside of insurance issues, he should not be classified as a no show appointment.

## 2023-06-10 ENCOUNTER — Encounter: Payer: Non-veteran care | Admitting: Psychology

## 2023-06-25 ENCOUNTER — Ambulatory Visit: Payer: Non-veteran care | Admitting: Physician Assistant

## 2023-07-19 ENCOUNTER — Encounter: Payer: Self-pay | Admitting: Psychology

## 2023-08-23 ENCOUNTER — Ambulatory Visit (INDEPENDENT_AMBULATORY_CARE_PROVIDER_SITE_OTHER): Admitting: Psychology

## 2023-08-23 ENCOUNTER — Ambulatory Visit: Payer: Self-pay

## 2023-08-23 DIAGNOSIS — G3184 Mild cognitive impairment, so stated: Secondary | ICD-10-CM

## 2023-08-23 DIAGNOSIS — R4189 Other symptoms and signs involving cognitive functions and awareness: Secondary | ICD-10-CM

## 2023-08-23 NOTE — Progress Notes (Signed)
   Psychometrician Note   Cognitive testing was administered to Joseph Khan by Abbott Abbot, B.S. (psychometrist) under the supervision of Dr. Janice Meeter, Psy.D., licensed psychologist on 08/23/2023. Joseph Khan did not appear overtly distressed by the testing session per behavioral observation or responses across self-report questionnaires. Rest breaks were offered.    The battery of tests administered was selected by Dr. Janice Meeter, Psy.D. with consideration to Joseph Khan current level of functioning, the nature of his symptoms, emotional and behavioral responses during interview, level of literacy, observed level of motivation/effort, and the nature of the referral question. This battery was communicated to the psychometrist. Communication between Dr. Janice Meeter, Psy.D. and the psychometrist was ongoing throughout the evaluation and Dr. Janice Meeter, Psy.D. was immediately accessible at all times. Dr. Janice Meeter, Psy.D. provided supervision to the psychometrist on the date of this service to the extent necessary to assure the quality of all services provided.    Joseph Khan will return within approximately 1-2 weeks for an interactive feedback session with Dr. Donavon Fudge at which time his test performances, clinical impressions, and treatment recommendations will be reviewed in detail. Joseph Khan understands he can contact our office should he require our assistance before this time.  A total of 135 minutes of billable time were spent face-to-face with Joseph Khan by the psychometrist. This includes both test administration and scoring time. Billing for these services is reflected in the clinical report generated by Dr. Janice Meeter, Psy.D.  This note reflects time spent with the psychometrician and does not include test scores or any clinical interpretations made by Dr. Donavon Fudge. The full report will follow in a separate note.

## 2023-08-23 NOTE — Progress Notes (Unsigned)
 NEUROPSYCHOLOGICAL EVALUATION [name]. [facility] Department of Neurology  Date of Evaluation: 08/23/2023  REASON FOR REFERRAL   Joseph Khan is a 78 year old, right-handed, Black  male with 14 years of formal education. He was referred for neuropsychological evaluation by Tex Filbert, PA-C, to assess current neurocognitive functioning, document potential cognitive deficits, and assist with treatment planning. This is his first neuropsychological evaluation.  SUMMARY OF RESULTS   Premorbid cognitive abilities are estimated to be in the average range based on word reading and sociodemographic factors. Given this estimate, performance today was variable across measures of processing speed, visuoconstruction, executive functioning, and learning/memory.   Performance on most timed tasks was within expectations, including measures of visual scanning, rapid color naming, rapid word reading, and even some timed tasks within executive and motor domains (e.g., alternating attention, verbal fluency, fine motor dexterity). In contrast, performance was below expectations on tasks of rapid decoding and visual discrimination. Visuospatial abilities appeared generally intact, although his performance when constructing block designs was notably low. It is unclear whether this reflected a primary visuoconstructional weakness, reduced processing speed, or a combination of both, as the task was discontinued early. Most measures of executive functioning were intact apart from response inhibition and inhibition/switching. He was slow to complete the inhibition trial and made an elevated number of errors on the inhibition/switching trial. He performed well on measures of alternating attention, abstract reasoning, phonemic fluency, and novel problem-solving.  Regarding learning/memory, he demonstrated intact encoding, recall, and recognition of short stories but performed below expectations with  the word list and shapes. These low scores appeared to reflect both reduced initial acquisition and retrieval difficulties, which likely contributed to reduced recognition performance.  Remaining scores in other domains--including attention/working memory, language, and fine motor dexterity--were within expectations. On self-report questionnaires, he endorsed mild depressive symptoms and elevated daytime sleepiness but did not report clinically significant anxiety.  DIAGNOSTIC IMPRESSION   Results of the current evaluation indicated variable performance in the domains of processing speed, visuospatial skills, executive functioning, and learning/memory. 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 Etiology of the deficits observed today is likely multifactorial, including chronic physical discomfort (e.g., ongoing knee and back issues), daytime fatigue and low energy, mood-related changes, reduced cognitive and social engagement, and, depending on if taken often, possible sedating effects of certain allergy medications (e.g., chlorpheniramine, cetirizine). Notably, many of the memory concerns described during the clinical interview seemed more related to lapses in attention. Additional support for this was provided by the wife's report that the patient "spaces out." While objective testing did not reveal significant attention deficits, it is important to highlight that our testing environment is highly structured, which differs from real-world conditions. Personal factors, such as those mentioned above, can often act as internal distractions in real-world settings, potentially leading to suboptimal attention. Results provide a useful baseline for future comparison, should re-evaluation become necessary.  ICD-10 Codes: G31.84 Mild cognitive impairment  RECOMMENDATIONS   In consultation with your doctor, schedule  cognitive reevaluation on an as-needed basis to assess for cognitive decline and update treatment recommendations. Reevaluation should occur during a period of medical and affective stability.  Monitor mood given reports of mild depression, especially since that emotional distress can exacerbate cognitive difficulties. If symptoms begin to interfere with daily functioning, you may wish to revisit the option of taking the mood medication that was prescribed to you.  Continue managing vascular  risk factors through a heart healthy diet, physician-approved physical activity, and medication adherence.   Aim to regularly participate in activities that you find enjoyable and fulfilling, while being mindful of your physical limitations. This could include hobbies, socializing with loved ones, or engaging in low-impact outdoor activities. Such involvement can help improve mood, increase motivation, and provide cognitive stimulation.  To address reduced processing speed, he may wish to consider:    -Ensuring that he is alerted when essential material or instructions are being presented   -Adjusting the speed at which new information is presented   -Allowing for more time in comprehending, processing, and responding in conversation   -Repeating and paraphrasing instructions or conversations aloud  To address problems with attention and/or executive dysfunction, he may wish to consider:    -Avoiding external distractions when needing to concentrate   -Limiting exposure to fast paced environments with multiple sensory demands   -Writing down complicated information and using checklists   -Attempting and completing one task at a time (i.e., no multi-tasking)   -Verbalizing aloud each step of a task to maintain focus   -Taking frequent breaks during the completion of steps/tasks to avoid fatigue   -Reducing the amount of information considered at one time   -Scheduling more difficult activities for a time  of day where he is usually most alert  DISPOSITION   Patient will follow up with the referring provider, [name]. No follow-up neuropsychological testing was scheduled at this time. Please feel free to refer the patient for repeated evaluation if he shows a significant change in neurocognitive status. He and his wife will be provided verbal feedback in approximately one week regarding the findings and impression during this visit.  The remainder of the report includes the details of the patient's background and a table of results from the current evaluation, which support the summary and recommendations described above.  BACKGROUND   History of Presenting Illness: The following information was obtained from a review of medical records and an interview with the patient and his wife, Ozie Bo. Patient established care with neurology on 01/26/2022 due to progressively worsening memory difficulties. For example, he may forget details of conversations or why he walked into a room. MoCA = 26/30. Neuropsychological evaluation was requested accordingly.  Cognitive Functioning: During today's appointment, the patient reported having short-term memory difficulties. When asked for examples of what he might forget, he said "everything." He reported increased reliance on written notes and reminders. He endorsed issues with attention, such as losing his train of thought, going to the fridge instead of the microwave, and missing turns while driving. Additionally, he reported difficulties with processing speed, organization, and, occasionally, word-finding. His wife generally corroborated his report. She mentioned that he tends to space out or forget what he is saying. She also observed that it takes him longer to plan, organize, and problem-solve. Patient believes these changes have been present for the last 1-2 years and have gradually worsened.  Physical Functioning: Patient reported improved sleep with trazodone   and is compliant with his CPAP. He continues to experience low energy throughout the day and can easily overexert himself. His appetite is stable, but his sense of taste and smell has been variable since his COVID infection. Vision and hearing remain stable. He endorsed very mild balance difficulties related to his knee and back, with some near falls, though he has not actually fallen. He denied any tremors.  Emotional Functioning: Patient described his mood as stable. He reported that  he can be "short-fused," which his wife confirms. She further noted that he has a tendency to be moody. He denied feeling anxious or depressed and also denied suicidal ideation. His wife explained that he has been highly independent all his life, so the changes in his cognitive and physical functioning have emotionally affected him, as these changes mark a shift from his baseline. He is currently unable to exercise much at this time, with the exception of physical therapy.  Imaging: MRI of the brain (03/17/2022) documented mild age-related volume loss, very minimal small vessel change of the hemispheric white matter, and presumed previous transsphenoidal surgery of the pituitary gland.  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 No history of stroke, CNS infection, head injury, or seizure was reported.  Current Medications: Per record, amlodipine, aspirin , benazepril , cetirizine, chlorpheniramine, diclofenac , fluticasone, omeprazole, prednisone , psyllium, sodium chloride  nasal spray, testosterone gel, tizanidine , trazodone , and vitamin D3. Although he has been prescribed fluoxetine, he reported that he has never taken it.  Functional Status: Patient continues to drive without recent accidents, traffic  violations, or instances of getting lost. He manages the finances but has recently made some errors, such as forgetting a bill payment, recording a transaction in his checkbook without actually paying it, or charging to the wrong account. He independently manages his medications, though he occasionally misses a dose or places a pill in the wrong compartment of his pillbox. He denied any difficulties using household appliances. He is generally independent with all basic activities of daily living, though he may require some assistance from his wife with certain aspects of bathing due to knee and back issues.  Family Neurological History: Unremarkable.   Psychiatric History: Remarkable for PTSD, per record. History of depression, anxiety, suicidal ideation, and psychiatric hospitalizations was not reported. Patient sees a [facility] [place] as needed. He denied experiencing visual hallucinations but mentioned occasionally seeing things in his peripheral vision, though when he turns to look, nothing is there.  Substance Use History: Patient denied current use of alcohol, nicotine, marijuana, and illicit substances. He noted that he used to smoke cigars but quit in 1991.  Social and Developmental History: Patient was born in [place], [place]. History of perinatal complications and developmental delays was not reported. He is married and has four children. He currently resides with his wife and one of his sons.  Educational and Occupational History: No history of childhood learning disability, special education services, or grade retention was reported. Patient described generally receiving good grades in school. He obtained an associate degree in business administration. 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 He experienced an incident on the job where he tore his rotator cuff,  which led to him going on disability, and he has not returned to work since.  BEHAVIORAL OBSERVATIONS   Patient arrived on time and was accompanied by his wife, Ozie Bo. He ambulated with a cane. He was alert and fully oriented. He was appropriately groomed and dressed for the setting. No significant sensory or motor abnormalities were observed. Vision and hearing were adequate for testing purposes. Speech was of normal rate, prosody, and volume. No conversational word-finding difficulties, paraphasic errors, or dysarthria were observed. Comprehension was conversationally intact. Thought  processes were linear, logical, and coherent. Thought content was organized and devoid of delusions. Insight appeared appropriate. Affect was flat, although he occasionally made jokes. He was cooperative and gave adequate effort during testing, including on standalone and embedded measures of performance validity. Results are thought to accurately reflect his cognitive functioning at this time.  NEUROPSYCHOLOGICAL TESTING RESULTS   Tests Administered: Animal Naming Test; Brief Visuospatial Memory Test-Revised (BVMT-R) - Form 1; Controlled Oral Word Association Test (COWAT): FAS; Delis-Kaplan Executive Function System (D-KEFS) - Subtest(s): Color-Word Interference Test; Epworth Sleepiness Scale (ESS); Geriatric Anxiety Scale-10 Item (GAS-10); Geriatric Depression Scale Short Form (GDS-SF); Grooved Pegboard Test; [employer] Verbal Learning Test Revised (HVLT-R) - From 1; Judgment of Line Orientation (JLO) - Form V; Neuropsychological Assessment Battery (NAB) - Subtest(s): Naming Form 1; Standalone performance validity test (PVT); Test of Premorbid Functioning (TOPF); Trail Making Test (TMT); Wechsler Adult Intelligence Scale Fifth Edition (WAIS-5) - Subtest(s): Similarities, Clinical cytogeneticist, Matrix Reasoning, Digit Sequencing, Coding, Running Digits, Symbol Search, Symbol Span; Wechsler Memory Scale Fourth Edition (WMS-IV) -  Subtest(s): Logical Memory (LM); and [place]  Card Sorting Test 64 Card Version (WCST-64).  Test results are provided in the table below. Whenever possible, the patient's scores were compared against age-, sex-, and education-corrected normative samples. Interpretive descriptions are based on the AACN consensus conference statement on uniform labeling (Guilmette et al., 2020).  PREMORBID FUNCTIONING RAW  RANGE  TOPF 38 StdS=99 Average  ATTENTION & WORKING MEMORY RAW  RANGE  WAIS-5 Digit Sequencing -- ss=12 High Average  WAIS-5 Running Digits -- ss=11 Average  WAIS-5 Symbol Span -- ss=7 Low Average  PROCESSING SPEED RAW  RANGE  Trails A 31''0e T=62 High Average  WAIS-5 Coding  -- ss=5 Below Average  WAIS-5 Symbol Search -- ss=4 Below Average  DKEFS CWIT Color Naming 41''0e ss=7 Low Average  DKEFS CWIT Word Reading 29''0e ss=8 Average  EXECUTIVE FUNCTION RAW  RANGE  Trails B 96''2e T=59 High Average  WAIS-5 Matrix Reasoning -- ss=8 Average  WAIS-5 Similarities -- ss=6 Low Average  COWAT Letter Fluency 12+13+8 T=50 Average  DKEFS CWIT Inhibition 102''4e ss=5 Below Average  DKEFS CWIT Inhibition/Switching 88''14e ss=9 Average  WCST-64 Total Errors 34 T=38 Low Average  WCST-64 Perseverative Errors 24 T=37 Low Average  WCST-64 Nonperseverative Errors 10 T=47 Average  WCST-64 Categories Completed 2 >16%ile WNL  WCSR-64 FMS 0 -- --  LANGUAGE RAW  RANGE  COWAT Letter Fluency 12+13+8 T=50 Average  Animal Naming Test 14 T=49 Average  NAB Naming Test 28/31 T=41 WNL  VISUOSPATIAL RAW    WAIS-5 Block Design -- ss=5 Below Average  JLO C/S=19/30 9%ile Low Average  BVMT-R Copy Trial 12/12 -- WNL  VERBAL LEARNING & MEMORY RAW  RANGE  HVLT Learning Trials (3+6+6)/36 T=34 Below Average  HVLT Delayed Recall 3/12 T=31 Below Average  HVLT Recognition Hits 10 -- --  HVLT Recognition False Positives 3 -- --  HVLT Discrimination Index 7 T=32 Below Average  WMS-IV LM-I  (8+12+11)/53 ss=10 Average   WMS-IV LM-II  (7+13)/39 ss=11 Average  WMS-IV LM Recognition  (7+12)/23 51-75%ile Average  VISUOSPATIAL LEARNING & MEMORY RAW  RANGE  BVMT-R Total Recall (0+1+4)/36 T=24 Exceptionally Low  BVMT-R Delayed Recall 0/12 T=20 Exceptionally Low  BVMT-R Recognition Hits 5 >16%ile WNL  BVMT-R Recognition False Alarms 2 <1%ile Exceptionally Low  BVMT-R Recognition Discrimination Index 3 3-5%ile Below Average  FINE MOTOR DEXTERITY RAW  RANGE  Grooved Pegboard (Dominant Hand) 141''1d T=39 Low Average  Grooved Pegboard (Non-Dominant Hand) 137''1d T=42  Low Average  QUESTIONNAIRES RAW  RANGE  GDS-SF 6 -- Mild  GAS-10 2 -- Minimal  ESS 10 -- Elevated  *Note: ss = scaled score; StdS = standard score; T = t-score; C/S = corrected raw score; WNL = within normal limits; BNL= below normal limits; D/C = discontinued. Scores from skewed distributions are typically interpreted as WNL (>=16th %ile) or BNL (<16th %ile).   INFORMED CONSENT   Patient was provided with a verbal description of the nature and purpose of the neuropsychological evaluation. Also reviewed were the foreseeable risks and/or discomforts and benefits of the procedure, limits of confidentiality, and mandatory reporting requirements of this provider. Patient was given the opportunity to have their questions answered. Oral consent to participate was provided by the patient.   This report was prepared as part of a clinical evaluation and is not intended for forensic use.  SERVICE   This evaluation was conducted by Janice Meeter, Psy.D. In addition to time spent directly with the patient, total professional time includes record review, integration of relevant medical history, test selection, interpretation of findings, and report preparation. A technician, Abbott Abbot, B.S., provided testing and scoring assistance for 135 minutes.  Psychiatric Diagnostic Evaluation Services (Professional): 40981 x 1 Neuropsychological Testing Evaluation  Services (Professional): 19147 x 1 Neuropsychological Testing Evaluation Services (Professional): 82956 x 2 Neuropsychological Test Administration and Scoring (Technician): [phone] x 1 Neuropsychological Test Administration and Scoring (Technician): [phone] x 3  This report was generated using voice recognition software. While this document has been carefully reviewed, transcription errors may be present. I apologize in advance for any inconvenience. Please contact me if further clarification is needed.            Janice Meeter, Psy.D.             Neuropsychologist

## 2023-09-02 ENCOUNTER — Ambulatory Visit (INDEPENDENT_AMBULATORY_CARE_PROVIDER_SITE_OTHER): Admitting: Psychology

## 2023-09-02 DIAGNOSIS — G3184 Mild cognitive impairment, so stated: Secondary | ICD-10-CM | POA: Diagnosis not present

## 2023-09-02 NOTE — Progress Notes (Signed)
   NEUROPSYCHOLOGY FEEDBACK SESSION [place]. [facility]  [place] Department of Neurology  Date of Feedback Session: 09/02/2023  REASON FOR REFERRAL   Joseph Khan is a 78 year old, right-handed, Black  male with 14 years of formal education. He was referred for neuropsychological evaluation by Tex Filbert, PA-C, to assess current neurocognitive functioning, document potential cognitive deficits, and assist with treatment planning. This is his first neuropsychological evaluation.  FEEDBACK   Patient completed a comprehensive neuropsychological evaluation on 08/23/2023. Please refer to that encounter for the full report and recommendations. Briefly, results indicated variable performance in the domains of processing speed, visuospatial skills, executive functioning, and learning/memory. 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 Etiology of the deficits observed today is likely multifactorial, including chronic physical discomfort (e.g., ongoing knee and back issues), daytime fatigue and low energy, mood-related changes, reduced cognitive and social engagement, and, depending on if taken often, possible sedating effects of certain allergy medications (e.g., chlorpheniramine, cetirizine). Notably, many of the memory concerns described during the clinical interview seemed more related to lapses in attention. Additional support for this was provided by the wife's report that the patient "spaces out." While objective testing did not reveal significant attention deficits, it is important to highlight that our testing environment is highly structured, which differs from real-world conditions. Personal factors, such as those mentioned above, can often act as internal distractions in real-world settings, potentially leading to suboptimal attention.   Today, the patient was accompanied by his wife.  They were provided verbal feedback regarding the findings and impression during this visit, and their questions were answered. A copy of the report was provided at the conclusion of the visit.  DISPOSITION   Patient will follow up with the referring provider, [name]. No follow-up neuropsychological testing was scheduled at this time. Please feel free to refer the patient for repeated evaluation if he shows a significant change in neurocognitive status.  SERVICE   This feedback session was conducted by Janice Meeter, Psy.D. [place] 16[address] (35 minutes) was billed for Dr. Donavon Fudge' time spent in preparing, conducting, and documenting the current feedback session.  This report was generated using voice recognition software. While this document has been carefully reviewed, transcription errors may be present. I apologize in advance for any inconvenience. Please contact me if further clarification is needed.

## 2023-11-30 ENCOUNTER — Other Ambulatory Visit: Payer: Self-pay

## 2023-11-30 ENCOUNTER — Ambulatory Visit (HOSPITAL_COMMUNITY)
Admission: RE | Admit: 2023-11-30 | Discharge: 2023-11-30 | Disposition: A | Discharge status: Home / Self Care | Source: Ambulatory Visit | Attending: Family Medicine | Admitting: Family Medicine

## 2023-11-30 ENCOUNTER — Encounter (HOSPITAL_COMMUNITY): Payer: Self-pay

## 2023-11-30 ENCOUNTER — Ambulatory Visit (INDEPENDENT_AMBULATORY_CARE_PROVIDER_SITE_OTHER)

## 2023-11-30 VITALS — BP 102/70 | HR 85 | Temp 97.5°F | Resp 20

## 2023-11-30 DIAGNOSIS — M25551 Pain in right hip: Secondary | ICD-10-CM | POA: Diagnosis not present

## 2023-11-30 MED ORDER — DEXAMETHASONE SODIUM PHOSPHATE 10 MG/ML IJ SOLN
INTRAMUSCULAR | Status: AC
Start: 2023-11-30 — End: 2023-11-30
  Filled 2023-11-30: qty 1

## 2023-11-30 MED ORDER — TIZANIDINE HCL 4 MG PO TABS: 4.0000 mg | ORAL_TABLET | Freq: Three times a day (TID) | ORAL | 0 refills | Status: AC | PRN

## 2023-11-30 MED ORDER — DEXAMETHASONE SODIUM PHOSPHATE 10 MG/ML IJ SOLN
10.0000 mg | Freq: Once | INTRAMUSCULAR | Status: AC
  Administered 2023-11-30: 10 mg via INTRAMUSCULAR

## 2023-11-30 NOTE — ED Triage Notes (Signed)
 Pt st's approx 2 weeks ago he hit his right knee and was seen by ortho for same.  St's approx 10 days ago the pain started radiating from his knee to his right hip.  Pt denies any pain in the knee but st's pain continues in right hip.  Pt denies any injury to hip

## 2023-11-30 NOTE — ED Provider Notes (Signed)
 [facility]   [id number] 11/30/23 Arrival Time: 1018  ASSESSMENT & PLAN:  1. Acute hip pain, right    I have personally viewed and independently interpreted the imaging studies ordered this visit. R hip with 1V Pelvis: no acute bony changes.  Trial of: Meds ordered this encounter  Medications   tiZANidine  (ZANAFLEX ) 4 MG tablet    Sig: Take 1 tablet (4 mg total) by mouth every 8 (eight) hours as needed for muscle spasms.    Dispense:  30 tablet    Refill:  0   dexamethasone  (DECADRON ) injection 10 mg   Mobility as tolerated. Will f/u with orthopaedist if not improving.  Reviewed expectations re: course of current medical issues. Questions answered. Outlined signs and symptoms indicating need for more acute intervention. Patient verbalized understanding. After Visit Summary given.  SUBJECTIVE: History from: patient. Joseph Khan is a 78 y.o. male who reports hitting his right knee and was seen by ortho for same. Knee is better. Now with pain in R hip, esp with ambulation and prolonged sitting. Affecting sleep. Denies extremity sensation changes or weakness. Celebrex without relief.  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   Procedure: LEFT TOTAL KNEE ARTHROPLASTY;  Surgeon: Donnice Car, MD;  Location: [facility];  Service: Orthopedics;  Laterality: Left;      OBJECTIVE:  Vitals:   11/30/23 1040  BP: 102/70  Pulse: 85  Resp: 20  Temp: (!) 97.5 F (36.4 C)  TempSrc: Oral  SpO2: 98%    General appearance: alert; no distress HEENT: [place]; AT Neck: supple with FROM Resp: unlabored respirations Extremities: RLE: warm with well perfused appearance; poorly localized 'soreness' over R hip into R buttock Skin: warm and dry; no visible rashes Neurologic: gait normal; normal sensation and strength of bilateral  LE Psychological: alert and cooperative; normal mood and affect  Imaging: DG Hip Unilat W or Wo Pelvis 1 View Right Result Date: 11/30/2023 EXAM: 1 VIEW XRAY OF THE RIGHT HIP 11/30/2023 11:09:12 AM COMPARISON: None available. CLINICAL HISTORY: Pain x 10 days. Pt states approximately 2 weeks ago he hit his right knee and was seen by ortho for same. States approximately 10 days ago the pain started radiating from his knee to his right hip. Pt denies any pain in the knee but states pain continues in right hip. Pt denies any injury to hip. FINDINGS: BONES AND JOINTS: No acute fracture or focal osseous lesion. The hip joint is maintained. No significant degenerative changes. Joint spaces are maintained for age. SOFT TISSUES: The soft tissues are unremarkable. Mild limitations secondary to patient body habitus. IMPRESSION: 1. No acute findings in the right hip. 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   Arthritis    Osteoarthrits /pain left knee   Atopic dermatitis, unspecified    Chronic back pain    Chronic post-traumatic stress disorder    Chronic rhinitis    Chronically dry eyes    bilateral   Dental caries on pit and fissure surface penetrating into dentin    Dental caries on smooth surface penetrating  into dentin    Deposits (accretions) on teeth    Essential (primary) hypertension    Exposure to Agent Orange    Gastroesophageal reflux disease without esophagitis    Gynecomastia    Hypertension    Hypertrophy of inferior nasal turbinate 02/24/2019   Hypogonadism in male    Nasal crusting 03/23/2019   Obesity    Obstructive sleep apnea    Osteoarthritis    Polyp of sphenoidal sinus    Pruritus, unspecified    Puckering of macula,  bilateral    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   Stiffness of left knee 08/10/2022   Varicose veins of lower extremities with inflammation    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   Vesicular hand eczema    Social History   Socioeconomic History   Marital status: Married    Spouse name: Not on file   Number of children: 4   Years of education: 14   Highest education level: Not on file  Occupational History   Not on file  Tobacco Use   Smoking status: Former    Types: Cigars    Quit date: 01/10/1982    Years since quitting: 41.9   Smokeless tobacco: Former    Types: Chew    Quit date: 01/15/1989  Vaping Use   Vaping status: Never Used  Substance and Sexual Activity   Alcohol use: No   Drug use: No   Sexual activity: Yes  Other Topics Concern   Not on file  Social History Narrative   Right handed   Drinks caffeine   Two story level   Social Drivers of Corporate investment banker Strain: Not on file  Food Insecurity: No Food Insecurity (08/08/2022)   Hunger Vital Sign    Worried About Running Out of Food in the Last Year: Never true    Ran Out of Food in the Last Year: Never true  Transportation Needs: No Transportation Needs (08/08/2022)   PRAPARE - Administrator, Civil Service (Medical): No    Lack of Transportation (Non-Medical): No  Physical Activity: Not on file  Stress: Not on file  Social Connections: Not on file   Family History  Problem Relation Age of Onset   CVA Mother    Other Father    Colon cancer Neg Hx    Colon polyps Neg Hx    Esophageal cancer Neg Hx    Rectal cancer Neg Hx    Stomach cancer Neg Hx    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

## 2023-11-30 NOTE — Discharge Instructions (Signed)
 Meds ordered this encounter  Medications   tiZANidine  (ZANAFLEX ) 4 MG tablet    Sig: Take 1 tablet (4 mg total) by mouth every 8 (eight) hours as needed for muscle spasms.    Dispense:  30 tablet    Refill:  0   dexamethasone  (DECADRON ) injection 10 mg
